# Patient Record
Sex: Male | Born: 1965 | Race: White | Hispanic: No | Marital: Single | Smoking: Current every day smoker
Health system: Southern US, Community
[De-identification: ages and names within clinical notes are randomized; demographics above are authoritative.]

---

## 2007-02-20 ENCOUNTER — Emergency Department: Payer: Self-pay | Admitting: Emergency Medicine

## 2007-06-09 ENCOUNTER — Emergency Department: Payer: Self-pay | Admitting: Emergency Medicine

## 2008-11-01 ENCOUNTER — Emergency Department: Payer: Self-pay | Admitting: Internal Medicine

## 2009-02-01 ENCOUNTER — Inpatient Hospital Stay: Payer: Self-pay | Admitting: Psychiatry

## 2010-04-10 ENCOUNTER — Emergency Department: Payer: Self-pay | Admitting: Emergency Medicine

## 2010-12-19 ENCOUNTER — Emergency Department (HOSPITAL_COMMUNITY)
Admission: EM | Admit: 2010-12-19 | Discharge: 2010-12-23 | Disposition: A | Discharge status: Other Acute Inpatient Hospital | Payer: Self-pay | Attending: Emergency Medicine | Admitting: Emergency Medicine

## 2010-12-19 DIAGNOSIS — N476 Balanoposthitis: Secondary | ICD-10-CM | POA: Insufficient documentation

## 2010-12-19 DIAGNOSIS — R45850 Homicidal ideations: Secondary | ICD-10-CM | POA: Insufficient documentation

## 2010-12-19 DIAGNOSIS — R569 Unspecified convulsions: Secondary | ICD-10-CM | POA: Insufficient documentation

## 2010-12-19 LAB — CBC
MCH: 32.6 pg (ref 26.0–34.0)
MCHC: 35.4 g/dL (ref 30.0–36.0)
MCV: 92.1 fL (ref 78.0–100.0)
Platelets: 282 10*3/uL (ref 150–400)
RDW: 12.3 % (ref 11.5–15.5)

## 2010-12-19 LAB — COMPREHENSIVE METABOLIC PANEL
Albumin: 4.7 g/dL (ref 3.5–5.2)
BUN: 13 mg/dL (ref 6–23)
Creatinine, Ser: 1.22 mg/dL (ref 0.4–1.5)
Total Bilirubin: 0.6 mg/dL (ref 0.3–1.2)
Total Protein: 8.1 g/dL (ref 6.0–8.3)

## 2010-12-19 LAB — RAPID URINE DRUG SCREEN, HOSP PERFORMED
Barbiturates: NOT DETECTED
Benzodiazepines: POSITIVE — AB

## 2010-12-19 LAB — DIFFERENTIAL
Eosinophils Absolute: 0.3 10*3/uL (ref 0.0–0.7)
Eosinophils Relative: 4 % (ref 0–5)
Lymphs Abs: 2.8 10*3/uL (ref 0.7–4.0)
Monocytes Relative: 6 % (ref 3–12)

## 2010-12-19 LAB — PHENYTOIN LEVEL, TOTAL: Phenytoin Lvl: <2.5 ug/mL — ABNORMAL LOW (ref 10.0–20.0)

## 2010-12-20 DIAGNOSIS — F1910 Other psychoactive substance abuse, uncomplicated: Secondary | ICD-10-CM

## 2010-12-21 DIAGNOSIS — F319 Bipolar disorder, unspecified: Secondary | ICD-10-CM
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
## 2010-12-22 DIAGNOSIS — F1910 Other psychoactive substance abuse, uncomplicated: Secondary | ICD-10-CM

## 2010-12-23 ENCOUNTER — Inpatient Hospital Stay (HOSPITAL_COMMUNITY)
Admission: AD | Admit: 2010-12-23 | Discharge: 2010-12-25 | DRG: 885 | Disposition: A | Discharge status: Home / Self Care | Payer: PRIVATE HEALTH INSURANCE | Source: Ambulatory Visit | Attending: Psychiatry | Admitting: Psychiatry

## 2010-12-23 DIAGNOSIS — R45850 Homicidal ideations: Secondary | ICD-10-CM

## 2010-12-23 DIAGNOSIS — F39 Unspecified mood [affective] disorder: Principal | ICD-10-CM

## 2010-12-23 DIAGNOSIS — M549 Dorsalgia, unspecified: Secondary | ICD-10-CM

## 2010-12-23 DIAGNOSIS — F1020 Alcohol dependence, uncomplicated: Secondary | ICD-10-CM

## 2010-12-23 DIAGNOSIS — G40909 Epilepsy, unspecified, not intractable, without status epilepticus: Secondary | ICD-10-CM

## 2010-12-23 DIAGNOSIS — R45851 Suicidal ideations: Secondary | ICD-10-CM

## 2010-12-23 DIAGNOSIS — F1910 Other psychoactive substance abuse, uncomplicated: Secondary | ICD-10-CM

## 2010-12-23 DIAGNOSIS — G8929 Other chronic pain: Secondary | ICD-10-CM

## 2010-12-24 DIAGNOSIS — F1020 Alcohol dependence, uncomplicated: Secondary | ICD-10-CM

## 2010-12-24 DIAGNOSIS — F39 Unspecified mood [affective] disorder: Secondary | ICD-10-CM
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
## 2011-01-18 ENCOUNTER — Emergency Department (HOSPITAL_COMMUNITY)
Admission: EM | Admit: 2011-01-18 | Discharge: 2011-01-19 | Disposition: A | Discharge status: Home / Self Care | Payer: PRIVATE HEALTH INSURANCE | Attending: Emergency Medicine | Admitting: Emergency Medicine

## 2011-01-18 DIAGNOSIS — F1410 Cocaine abuse, uncomplicated: Secondary | ICD-10-CM | POA: Insufficient documentation

## 2011-01-18 DIAGNOSIS — F1010 Alcohol abuse, uncomplicated: Secondary | ICD-10-CM | POA: Insufficient documentation

## 2011-01-18 DIAGNOSIS — R259 Unspecified abnormal involuntary movements: Secondary | ICD-10-CM | POA: Insufficient documentation

## 2011-01-18 LAB — ETHANOL: Alcohol, Ethyl (B): 56 mg/dL — ABNORMAL HIGH (ref 0–10)

## 2011-01-18 LAB — COMPREHENSIVE METABOLIC PANEL
BUN: 10 mg/dL (ref 6–23)
CO2: 28 mEq/L (ref 19–32)
Chloride: 100 mEq/L (ref 96–112)
Creatinine, Ser: 0.95 mg/dL (ref 0.4–1.5)
GFR calc non Af Amer: >60 mL/min (ref >60)
Glucose, Bld: 91 mg/dL (ref 70–99)
Total Bilirubin: 0.4 mg/dL (ref 0.3–1.2)

## 2011-01-18 LAB — DIFFERENTIAL
Basophils Relative: 0 % (ref 0–1)
Monocytes Absolute: 0.4 10*3/uL (ref 0.1–1.0)
Monocytes Relative: 5 % (ref 3–12)
Neutro Abs: 4.6 10*3/uL (ref 1.7–7.7)

## 2011-01-18 LAB — CBC
HCT: 41.9 % (ref 39.0–52.0)
Hemoglobin: 14.4 g/dL (ref 13.0–17.0)
MCH: 32.2 pg (ref 26.0–34.0)
MCHC: 34.4 g/dL (ref 30.0–36.0)
MCV: 93.7 fL (ref 78.0–100.0)

## 2011-01-19 LAB — RAPID URINE DRUG SCREEN, HOSP PERFORMED
Barbiturates: NOT DETECTED
Cocaine: POSITIVE — AB
Opiates: NOT DETECTED

## 2011-12-23 ENCOUNTER — Emergency Department: Payer: Self-pay | Admitting: Emergency Medicine

## 2011-12-23 LAB — URINALYSIS, COMPLETE
Blood: NEGATIVE
Glucose,UR: NEGATIVE mg/dL (ref 0–75)
Ketone: NEGATIVE
Specific Gravity: 1.008 (ref 1.003–1.030)
Squamous Epithelial: NONE SEEN
WBC UR: NONE SEEN /HPF (ref 0–5)

## 2012-06-11 LAB — COMPREHENSIVE METABOLIC PANEL
Anion Gap: 8 (ref 7–16)
BUN: 6 mg/dL — ABNORMAL LOW (ref 7–18)
Bilirubin,Total: 0.4 mg/dL (ref 0.2–1.0)
Chloride: 103 mmol/L (ref 98–107)
Creatinine: 1.05 mg/dL (ref 0.60–1.30)
EGFR (African American): >60
Osmolality: 269 (ref 275–301)
Potassium: 4.1 mmol/L (ref 3.5–5.1)
SGOT(AST): 31 U/L (ref 15–37)
Sodium: 136 mmol/L (ref 136–145)
Total Protein: 8.4 g/dL — ABNORMAL HIGH (ref 6.4–8.2)

## 2012-06-11 LAB — DRUG SCREEN, URINE
Barbiturates, Ur Screen: NEGATIVE (ref ?–200)
Benzodiazepine, Ur Scrn: NEGATIVE (ref ?–200)
Cannabinoid 50 Ng, Ur ~~LOC~~: NEGATIVE (ref ?–50)
Methadone, Ur Screen: NEGATIVE (ref ?–300)
Phencyclidine (PCP) Ur S: NEGATIVE (ref ?–25)

## 2012-06-11 LAB — CBC
HCT: 45.1 % (ref 40.0–52.0)
HGB: 15.3 g/dL (ref 13.0–18.0)
MCHC: 34 g/dL (ref 32.0–36.0)
RDW: 14.4 % (ref 11.5–14.5)
WBC: 10.9 10*3/uL — ABNORMAL HIGH (ref 3.8–10.6)

## 2012-06-11 LAB — ETHANOL: Ethanol: 217 mg/dL

## 2012-06-12 ENCOUNTER — Inpatient Hospital Stay: Payer: Self-pay | Admitting: Psychiatry

## 2012-07-23 ENCOUNTER — Emergency Department: Payer: Self-pay | Admitting: Emergency Medicine

## 2012-10-15 ENCOUNTER — Inpatient Hospital Stay: Payer: Self-pay | Admitting: Psychiatry

## 2012-10-15 LAB — CBC
MCH: 34 pg (ref 26.0–34.0)
MCHC: 35.5 g/dL (ref 32.0–36.0)
MCV: 96 fL (ref 80–100)
Platelet: 266 10*3/uL (ref 150–440)
RDW: 13.5 % (ref 11.5–14.5)

## 2012-10-15 LAB — DRUG SCREEN, URINE
Barbiturates, Ur Screen: NEGATIVE (ref ?–200)
Benzodiazepine, Ur Scrn: NEGATIVE (ref ?–200)
Cannabinoid 50 Ng, Ur ~~LOC~~: NEGATIVE (ref ?–50)
Cocaine Metabolite,Ur ~~LOC~~: POSITIVE (ref ?–300)
Methadone, Ur Screen: NEGATIVE (ref ?–300)

## 2012-10-15 LAB — COMPREHENSIVE METABOLIC PANEL
BUN: 7 mg/dL (ref 7–18)
Chloride: 105 mmol/L (ref 98–107)
Creatinine: 0.92 mg/dL (ref 0.60–1.30)
EGFR (African American): >60
EGFR (Non-African Amer.): >60
Glucose: 102 mg/dL — ABNORMAL HIGH (ref 65–99)
Potassium: 4 mmol/L (ref 3.5–5.1)
SGPT (ALT): 47 U/L (ref 12–78)

## 2012-10-15 LAB — URINALYSIS, COMPLETE
Ketone: NEGATIVE
Leukocyte Esterase: NEGATIVE
Nitrite: NEGATIVE
Ph: 6 (ref 4.5–8.0)
RBC,UR: NONE SEEN /HPF (ref 0–5)
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
## 2015-02-13 NOTE — H&P (Signed)
PATIENT NAME:  Craig Doyle, Craig Doyle MR#:  [id number] DATE OF BIRTH:  May 02, 1966  DATE OF ADMISSION:  10/15/2012  IDENTIFYING INFORMATION: The patient is a 49 year old male with a long history of alcohol dependence, presents to the Emergency Room in disheveled intoxicated state, confused, disorganized, reporting hallucinations.   CHIEF COMPLAINT: "I got off my medicine."   HISTORY OF PRESENT ILLNESS: Information is obtained from the patient and the chart. The patient states that he stopped taking his medicine about two weeks ago, and that is the reason why he started to hear things. He says he has been hearing voices and his mood has been bad. He is not able to give a whole lot of detailed history. He says he probably has not been sleeping well and has been feeling confused. He tries to minimize his drinking. He tells me that he thinks he had a couple of beers yesterday but in general has not been drinking a lot. He denies that he has been using any other recreational drugs. He says he has been living with his family and does not cite any other recent stress. He denies any new physical problems. He denies that he has been vomiting or having trouble keeping food down and denies any recent seizures.   PAST PSYCHIATRIC HISTORY: Multiple hospitalizations under similar circumstances. He tends to present with psychotic symptoms when he is intoxicated which clear up once he is detoxed. He has been treated with serotonin reuptake inhibitors with some benefit to stabilizing his mood but in general when he is sober does not appear to have an ongoing psychotic condition or bipolar condition. He takes Dilantin chronically, although honestly it is probably started for alcohol withdrawal seizures. He does have a history of erratic behavior when he is intoxicated. He also has a past history of suicide attempts. He has also been treated with antipsychotics in the past, although it is not clear that it has made much of a difference  longer term.   PAST MEDICAL HISTORY: The patient has some complaints of chronic pain from arthritis, an unclear seizure disorder which might be simply related to alcohol withdrawal.   SOCIAL HISTORY: He claims that he lives with his family. It is not clear right now what his social situation is. He is not able to work. He has minimal support in the community.   SUBSTANCE ABUSE HISTORY: Long history of alcohol dependence. History of delirium tremens as well as alcoholic hallucinosis in the past, probably alcohol withdrawal seizures as well. History as well of cocaine abuse.   CURRENT MEDICATIONS: According to the patient, it was Paxil 20 mg a day, and Dilantin 300 mg a day, and trazodone 100 mg at night. He says that he has not been taking any of them in a couple of weeks, however.   ALLERGIES: No known drug allergies.   REVIEW OF SYSTEMS: The patient complains of feeling shaky and sick and tired. He says that he has been seeing things today. He denies auditory hallucinations. He denies any acute suicidal ideation.   MENTAL STATUS EXAM: The patient is a very disheveled, poorly groomed man who looks chronically sick. He looks like he has lost weight. He is easily aroused and cooperates with the interview but gets distracted easily and has trouble providing detailed information. Eye contact is decreased. Psychomotor activity is slow and marked by a tremor. His speech is broken, fragmented and somewhat slurred, quiet. Affect is blunted and flat. Mood is stated as being bad. Thoughts are  slow, somewhat disorganized, but he did not make any grossly bizarre statements. He denies any auditory hallucinations but says he has had visual hallucinations today. He denies any current suicidal or homicidal ideation. His judgment and insight are chronically impaired. Intelligence is probably low average. Currently alert and oriented to being in the hospital but not to the specific date.   PHYSICAL  EXAMINATION: GENERAL: The patient is thin and looks like he has probably been losing weight, although I do not have a prior weight immediately to compare it to. No acute skin lesions.  HEENT: Pupils are equal and reactive. Face is symmetric. Eyes move normally. Dry oral mucosa. The patient has full range of motion at all extremities. He was not able to stand up without getting dizzy and having to sit back down, so I could not observe his gait. Strength and reflexes are symmetric and appear normal throughout.  NEUROLOGICAL: Cranial nerves symmetric.  LUNGS: Clear without any wheezes.  HEART: Regular rate and rhythm. Normal heart sounds.  ABDOMEN: Bowel sounds diminished. Abdomen is nontender.  VITAL SIGNS: Temperature 97.8, pulse 79, respirations 18, blood pressure currently 102/65.   LABORATORY RESULTS: Urinalysis is unremarkable. Drug screen positive for cocaine. TSH normal at 0.9. Salicylates slightly elevated at 4.7. CBC normal. Alcohol level at 2:00 this morning was 276.0. Chemistry panel shows elevated AST at 42, elevated total protein 8.3.   ASSESSMENT: A 49 year old man with alcohol dependence, and a history of DTs and alcoholic's hallucinosis. He presents intoxicated, needing alcohol detox to prevent seizures and delirium tremens and control his behavior.   TREATMENT PLAN: Admit to the hospital, put him on seizure precautions. Restart the Dilantin and put him on regular CIWA detox protocol. When he is able to, we will engage him in substance abuse programming and groups and work on appropriate discharge planning. Review current labs. No need to recheck anything at this time.   DIAGNOSIS, PRINCIPAL AND PRIMARY:  AXIS I: Alcohol dependence.   SECONDARY DIAGNOSES: AXIS I:  1. Cocaine abuse versus dependence.  2. 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   AXIS II: Deferred.   AXIS III: Chronic low weight, chronic arthritis pain.   AXIS IV: Moderate stress from  chronic disability and intoxication.   AXIS V: Functioning at time of evaluation 25.  ____________________________ Audery Amel, MD jtc:cb D: 10/15/2012 15:58:34 ET T: 10/15/2012 17:10:08 ET JOB#: [id number]  cc: Audery Amel, MD, <Dictator> Audery Amel MD ELECTRONICALLY SIGNED 10/16/2012 11:38

## 2015-02-13 NOTE — Discharge Summary (Signed)
PATIENT NAME:  Craig Doyle, Craig Doyle MR#:  [id number] DATE OF BIRTH:  11-25-1965  DATE OF ADMISSION:  10/15/2012 DATE OF DISCHARGE:  10/18/2012  HOSPITAL COURSE: See dictated history and physical for details of the admission. This 49 year old male with a history of alcohol dependence and mood disorder presented to the hospital intoxicated, having alcohol withdrawal, mood, irritability, anxiety, needing stabilization. In the hospital, he was treated with the usual alcohol withdrawal protocol. There were no seizures. He was a little bit confused and sedated, but no sign of gross delirium. He did not have any seizures. He was generally cooperative with treatment in the hospital. Required active detox treatment for a couple of days. At this point, he is no longer requiring any Ativan. He is showing normal vital signs, no longer having a tremor. His mental state has returned to baseline. He denies any suicidal ideation. The patient has no interest in returning to any residential treatment. He has been counseled about the life-threatening dangers of continuing to drink and the importance of staying sober and staying compliant with mental health treatment, and understands and agrees to that. He has a place to stay with some friends in the community. He will be discharged today with followup to be arranged through [facility] or similar local mental health followup.   DISCHARGE MEDICATIONS:  Trazodone 100 mg at night, Dilantin 300 mg a day, Paxil 20 mg per day.   LABORATORY RESULTS: Drug screen positive for cocaine. TSH normal at 0.9. Alcohol was 276 at admission. AST was elevated at 42, total protein elevated at 8.3. Glucose slightly elevated at 102. CBC was normal. Urinalysis unremarkable.   MENTAL STATUS EXAM AT DISCHARGE: Disheveled man who looks his stated age, cooperative with the interview. Good eye contact, normal psychomotor activity. Speech normal in rate, tone and volume. Affect euthymic, reactive and appropriate.  Mood stated as being okay. Thoughts are lucid without any loosening of associations or delusions. Denies auditory or visual hallucinations. Denies suicidal or homicidal ideation. Baseline intelligence low average, but judgment and insight at least grossly adequate. Agrees to outpatient treatment. Alert and oriented x4.   DISPOSITION: Discharge back to staying with friends in the community. Follow up with local mental health agency.   DIAGNOSES PRINCIPLE AND PRIMARY:  AXIS I: Alcohol dependence.   SECONDARY DIAGNOSES: AXIS I:  1.  Cocaine dependence.  2.  Mood disorder, not otherwise specified.  AXIS II: Deferred.  AXIS III: History of seizures.  AXIS IV: Moderate chronic stress from poor social support and poor compliance.  AXIS V: Functioning at time of discharge, 50.    ____________________________ Craig AmelJohn T. Clapacs, MD jtc:dm D: 10/18/2012 11:10:12 ET T: 10/18/2012 11:55:35 ET JOB#: [id number]  cc: Craig AmelJohn T. Clapacs, MD, <Dictator> Craig AmelJOHN T CLAPACS MD ELECTRONICALLY SIGNED 10/18/2012 13:09

## 2015-02-13 NOTE — Discharge Summary (Signed)
PATIENT NAME:  Craig Doyle, Craig Doyle MR#:  [id number] DATE OF BIRTH:  07-18-1966  DATE OF ADMISSION:  06/12/2012 DATE OF DISCHARGE:  06/17/2012  HOSPITAL COURSE: See dictated History and Physical for details of admission. This 49 year old man with a history of alcohol dependence came through the Emergency Room requesting detox and to be put back on his medicine. He had been drinking heavily and been off his antidepressants. His mood had been worse with worsening depression and anxiety. In the hospital, the patient was put on the standard detox protocol for alcohol withdrawal. He was somewhat sedated and withdrawn initially but eventually tolerated detox fine without delirium tremens or seizures. He was able to become interactive on the unit and participated in appropriate therapy. The patient was also started back on citalopram 20 mg per day, Dilantin 300 mg per day, and trazodone 100 mg at bedtime which had been previously effective for him. He tolerated medicines well with no side effects. His hygiene improved. Affect improved. The patient was able to articulate his desire to stay on his medicine and quit drinking in order that he could improve relations with his family from whom he has recently been estranged. At no time was he dangerous, aggressive or suicidal. The patient was discharged with plans that he will be staying with friends back in the community, follow up with local Mental Health resources.   DISCHARGE MEDICATIONS:  1. Citalopram 20 mg p.o. daily.  2. Dilantin 300 mg p.o. daily.  3. Trazodone 100 mg p.o. at bedtime.   LABORATORY, DIAGNOSTIC AND RADIOLOGICAL DATA: Admission labs showed a chemistry with a BUN slightly low at 6.0. Total protein slightly high at 8.4. Alcohol level was 217.0. CBC showed a slightly elevated white count at 10.9. Drug screen was positive for cocaine. EKG showed normal sinus rhythm, possible left ventricular hypertrophy. Rib and chest x-ray is normal.   MENTAL STATUS  EXAM AT DISCHARGE: A cleaned up man with more reasonable hygiene, neatly dressed. Eye contact still somewhat decreased. Affect flat-ish. Speech is normal in rate, tone, and volume. Thoughts appear to be somewhat simple but more fluid, no bizarre thinking, no evidence of delusional thinking. He denies hallucinations. He denies suicidal or homicidal ideation. Low-to-average intelligence. Adequate judgment and insight. Short and long-term memory grossly intact.  DISPOSITION: Discharged to the community to live with friends. Continue current medication.  Continue alcohol dependence outpatient treatment. He declined to go to further inpatient rehab.   DIAGNOSIS, PRINCIPAL AND PRIMARY:  AXIS I: Depression, not otherwise specified.   SECONDARY DIAGNOSES:  AXIS I:  1. Alcohol dependence.  2. Cocaine abuse.   AXIS II: Deferred.   AXIS III: Seizure disorder by history.   AXIS IV: Moderate-to-severe stress from having no settled place of his own and limited access to resources.   AXIS V: 50.  ____________________________ Audery AmelJohn T. Clapacs, MD jtc:cbb D: 06/30/2012 23:33:21 ET T: 07/01/2012 10:06:55 ET JOB#: [id number]  cc: Audery AmelJohn T. Clapacs, MD, <Dictator> Audery AmelJOHN T CLAPACS MD ELECTRONICALLY SIGNED 07/01/2012 11:14

## 2015-02-13 NOTE — Consult Note (Signed)
Brief Consult Note: Diagnosis: alcohol dep.   Patient was seen by consultant.   Recommend further assessment or treatment.   Orders entered.   Comments: Psychiatry: Patient seen. Alcohol dependent and with history of depression related to alcohol. Admit for detox. Patient agrees. Orders done.  Electronic Signatures: Audery Amellapacs, John T (MD)  (Signed 17-Aug-13 14:45)  Authored: Brief Consult Note   Last Updated: 17-Aug-13 14:45 by Audery Amellapacs, John T (MD)

## 2015-02-13 NOTE — H&P (Signed)
PATIENT NAME:  Craig Doyle, Salahuddin L MR#:  [id number] DATE OF BIRTH:  [birth year]  DATE OF ADMISSION:  06/12/2012  IDENTIFYING INFORMATION AND CHIEF COMPLAINT:  49 year old man with a history of alcohol dependence presents voluntarily to the Emergency Room asking to be "put back on my medicine".   HISTORY OF PRESENT ILLNESS: Information obtained from the patient and the old chart. The patient says that he has been feeling depressed and he blames this on being off of his "medicine". He feels sad and down. He feels hopeless. He feels like he cannot take care of himself.  He feels like his life is not going anywhere. He is sleeping poorly.  He feels confused in his thinking. He denies having any suicidal thoughts or wishes. He says that occasionally he hears funny things. He denies visual hallucinations. The patient seems to gloss over the fact that he is drinking a 12-pack of beer a day. He has a long history of alcohol dependence and in the past has been diagnosed as having alcohol dependence and depression primarily secondary to alcohol dependence. He tells me that he does not currently have a place to stay and he has been sleeping here and there, sometimes with his mother but often in the woods. His reason for presenting right now is just because he is tired of being the way he is and wants to "get back on my meds". He says this several times but the only medicine that he can specifically remember is Dilantin.   PAST PSYCHIATRIC HISTORY: The patient has a long history of alcohol dependence. He has had several admissions to our hospital in the past for very similar circumstances. The last time he was here was over three years ago. The patient tells me that he has been in prison since then for selling marijuana. He has been treated with antidepressants including Celexa and Zoloft in the past. He says he thinks they have been of some help with his depression and anxiety. Denies suicide attempts. Denies significant  violence. No history of psychosis.   MEDICAL HISTORY: The patient has a history of a seizure disorder. Looking back to old admissions, I see them referred to as withdrawal seizures. It is not clear to me if he has ever actually had seizures without alcohol withdrawal being involved. He tells me that he has, but I am not sure if he is the most reliable historian. In any case, he has been treated with Dilantin in the past. Does not know of any other ongoing medical problems.   SOCIAL HISTORY: The patient has no stable place to stay. His mother has called up here a couple of times and is clearly concerned about him, but he has not been living with her. Not working. Does not appear to be in any intimate relationship or have much in the way of support from other people.   SUBSTANCE ABUSE HISTORY: Long history of alcohol dependence. He is telling me right now that he has not used any other drugs recently and I went over a whole list of them that he denies. His drug screen, however, is positive for cocaine. He does have a history of selling and abusing marijuana in the past. He has a history of blackouts and complicated shaky alcohol withdrawal and withdrawal seizures.   CURRENT MEDICATIONS: None.   ALLERGIES: No known drug allergies.   REVIEW OF SYSTEMS:  He complains of feeling depressed and anxious and not sleeping well. He says that he is currently feeling  shaky and feels sick. He has some auditory hallucinations at times. Denies suicidal ideation.   MENTAL STATUS EXAM:  Disheveled man who looks like he has been living outside. He is tremulous all over. He attempts to be as cooperative as he can with the interview. Eye contact is minimal. Psychomotor activity is minimal but he does have a tremor. His speech is quiet and slurred and frequently difficult to understand. At times he seems to wander off into incoherence, but can usually be redirected back pretty easily. His affect is anxious and blunted. Mood  is stated as being bad. Thoughts are showing poverty of thinking, at times a little bit of confusion, but he does not make any grossly psychotic or bizarre statements and with encouragement and structure he can stay coherent. He endorses that he has had some auditory hallucinations. Denies visual hallucinations. Denies suicidal or homicidal ideation. He actually was oriented to the place and to the season but did not know the month or the year. He was able to repeat three words after a couple of tries and actually was able to remember all of them after about a minute or two.   PHYSICAL EXAMINATION:  GENERAL: 130-pound man which looks underweight for his height. He is disheveled, showing a diffuse tremor all over at rest.   HEENT: Pupils are equal and reactive. Face is symmetric.   NECK: Nontender.   BACK: Nontender.   MUSCULOSKELETAL: Full range of motion at extremities. Has a somewhat unsteady gait, probably from getting dizzy when he stands up. Strength and reflexes normal and symmetric throughout.   NEUROLOGICAL: Cranial nerves symmetric.   LUNGS: Minimal moving of air but no wheezes or extra sounds.   HEART: Regular rate and rhythm with normal heart sounds.   ABDOMEN: Soft, nontender, normal bowel sounds.   CURRENT VITAL SIGNS: Temperature 97.8, pulse 86, respirations 16, blood pressure 100/61. When he first came into the ER his blood pressure was 170/91, pulse 106 but he has gotten some Ativan since then. His skin is very deeply tanned all over. No acute lesions.   RESULTS OF LABORATORY TESTS: Drug screen positive for cocaine. He had rib films because he was complaining of some rib pain, but they are normal. He had a chest x-ray which was normal. TSH normal. Salicylates 3.6, which is slightly elevated. CBC shows a white blood cell count of 10.9, which is slightly elevated. On presentation his alcohol level was 217. Remarkably his liver enzymes are normal. Total protein is slightly  elevated at 8.4, albumin normal. BUN slightly low at 6. No significance to that.   ASSESSMENT:  49 year old man with alcohol dependence and depression and anxiety, probably largely secondary to his alcohol use. History of withdrawal seizures and complicated withdrawal. Very poor social situation. Evidently also using cocaine. Because of his history of alcohol withdrawal seizures and how rough he is looking right now, he needs hospitalization for detox and stabilization.   TREATMENT PLAN: Admit to psychiatry.  Full alcohol withdrawal protocol in place.  I will also restart him on Celexa 20 mg a day and Dilantin 300 mg a day, which he has been on in the past. Once he is detoxed, we will involve him in groups and activities on the unit, especially substance abuse-related. Substance abuse treatment team will work with him. We will see if we can make any arrangements for a better living situation and appropriate outpatient followup.   DIAGNOSIS PRINCIPLE AND PRIMARY:  AXIS I: Alcohol dependence.  SECONDARY DIAGNOSES:  AXIS I:  1. Cocaine abuse.  2. Depression, not otherwise specified.   AXIS II: Deferred.   AXIS III: Alcohol withdrawal seizure disorder.   AXIS IV: Severe from having no stable place to live.   AXIS V: Functioning at time of evaluation: 30.    ____________________________ Audery Amel, MD jtc:bjt D: 06/12/2012 14:55:29 ET T: 06/12/2012 15:29:26 ET JOB#: [id number]  cc: Audery Amel, MD, <Dictator> Audery Amel MD ELECTRONICALLY SIGNED 06/13/2012 16:48

## 2024-06-12 ENCOUNTER — Other Ambulatory Visit: Payer: Self-pay

## 2024-06-12 ENCOUNTER — Emergency Department: Payer: Self-pay

## 2024-06-12 ENCOUNTER — Inpatient Hospital Stay
Admission: EM | Admit: 2024-06-12 | Discharge: 2024-06-22 | DRG: 871 | Disposition: A | Discharge status: Home / Self Care | Payer: MEDICAID | Attending: Obstetrics and Gynecology | Admitting: Obstetrics and Gynecology

## 2024-06-12 DIAGNOSIS — E43 Unspecified severe protein-calorie malnutrition: Secondary | ICD-10-CM | POA: Diagnosis present

## 2024-06-12 DIAGNOSIS — I959 Hypotension, unspecified: Secondary | ICD-10-CM

## 2024-06-12 DIAGNOSIS — D649 Anemia, unspecified: Secondary | ICD-10-CM

## 2024-06-12 DIAGNOSIS — A419 Sepsis, unspecified organism: Secondary | ICD-10-CM | POA: Insufficient documentation

## 2024-06-12 DIAGNOSIS — D75838 Other thrombocytosis: Secondary | ICD-10-CM | POA: Diagnosis present

## 2024-06-12 DIAGNOSIS — Z5982 Transportation insecurity: Secondary | ICD-10-CM

## 2024-06-12 DIAGNOSIS — D75839 Thrombocytosis, unspecified: Secondary | ICD-10-CM

## 2024-06-12 DIAGNOSIS — A4902 Methicillin resistant Staphylococcus aureus infection, unspecified site: Secondary | ICD-10-CM

## 2024-06-12 DIAGNOSIS — K6812 Psoas muscle abscess: Principal | ICD-10-CM | POA: Diagnosis present

## 2024-06-12 DIAGNOSIS — A4901 Methicillin susceptible Staphylococcus aureus infection, unspecified site: Secondary | ICD-10-CM

## 2024-06-12 DIAGNOSIS — Z681 Body mass index (BMI) 19 or less, adult: Secondary | ICD-10-CM

## 2024-06-12 DIAGNOSIS — F17213 Nicotine dependence, cigarettes, with withdrawal: Secondary | ICD-10-CM | POA: Diagnosis present

## 2024-06-12 DIAGNOSIS — A4101 Sepsis due to Methicillin susceptible Staphylococcus aureus: Principal | ICD-10-CM | POA: Diagnosis present

## 2024-06-12 DIAGNOSIS — Z5912 Inadequate housing utilities: Secondary | ICD-10-CM

## 2024-06-12 DIAGNOSIS — Z5320 Procedure and treatment not carried out because of patient's decision for unspecified reasons: Secondary | ICD-10-CM | POA: Diagnosis present

## 2024-06-12 DIAGNOSIS — E871 Hypo-osmolality and hyponatremia: Secondary | ICD-10-CM | POA: Diagnosis present

## 2024-06-12 DIAGNOSIS — R6510 Systemic inflammatory response syndrome (SIRS) of non-infectious origin without acute organ dysfunction: Secondary | ICD-10-CM | POA: Insufficient documentation

## 2024-06-12 DIAGNOSIS — Z5941 Food insecurity: Secondary | ICD-10-CM

## 2024-06-12 DIAGNOSIS — E8809 Other disorders of plasma-protein metabolism, not elsewhere classified: Secondary | ICD-10-CM | POA: Diagnosis present

## 2024-06-12 DIAGNOSIS — D638 Anemia in other chronic diseases classified elsewhere: Secondary | ICD-10-CM | POA: Diagnosis present

## 2024-06-12 LAB — URINALYSIS, ROUTINE W REFLEX MICROSCOPIC
Bacteria, UA: NONE SEEN
Bilirubin Urine: NEGATIVE
Glucose, UA: NEGATIVE mg/dL
Hgb urine dipstick: NEGATIVE
Ketones, ur: 5 mg/dL — AB
Nitrite: NEGATIVE
Protein, ur: 100 mg/dL — AB
Specific Gravity, Urine: 1.023 (ref 1.005–1.030)
pH: 6 (ref 5.0–8.0)

## 2024-06-12 LAB — BASIC METABOLIC PANEL WITH GFR
Anion gap: 11 (ref 5–15)
BUN: 9 mg/dL (ref 6–20)
CO2: 25 mmol/L (ref 22–32)
Calcium: 9.2 mg/dL (ref 8.9–10.3)
Chloride: 98 mmol/L (ref 98–111)
Creatinine, Ser: 0.8 mg/dL (ref 0.61–1.24)
GFR, Estimated: >60 mL/min (ref >60)
Glucose, Bld: 171 mg/dL — ABNORMAL HIGH (ref 70–99)
Potassium: 4.6 mmol/L (ref 3.5–5.1)
Sodium: 134 mmol/L — ABNORMAL LOW (ref 135–145)

## 2024-06-12 LAB — CBC
HCT: 42.3 % (ref 39.0–52.0)
Hemoglobin: 15.2 g/dL (ref 13.0–17.0)
MCH: 34.2 pg — ABNORMAL HIGH (ref 26.0–34.0)
MCHC: 35.9 g/dL (ref 30.0–36.0)
MCV: 95.1 fL (ref 80.0–100.0)
Platelets: 596 K/uL — ABNORMAL HIGH (ref 150–400)
RBC: 4.45 MIL/uL (ref 4.22–5.81)
RDW: 11.9 % (ref 11.5–15.5)
WBC: 18.7 K/uL — ABNORMAL HIGH (ref 4.0–10.5)
nRBC: 0 % (ref 0.0–0.2)

## 2024-06-12 LAB — LACTIC ACID, PLASMA: Lactic Acid, Venous: 0.8 mmol/L (ref 0.5–1.9)

## 2024-06-12 MED ORDER — ACETAMINOPHEN 650 MG RE SUPP: 650.0000 mg | Freq: Four times a day (QID) | RECTAL | Status: DC | PRN

## 2024-06-12 MED ORDER — MORPHINE SULFATE (PF) 2 MG/ML IV SOLN
2.0000 mg | INTRAVENOUS | Status: DC | PRN
  Administered 2024-06-13 – 2024-06-19 (×6): 2 mg via INTRAVENOUS
  Filled 2024-06-12 (×7): qty 1

## 2024-06-12 MED ORDER — VANCOMYCIN HCL 750 MG/150ML IV SOLN
750.0000 mg | Freq: Two times a day (BID) | INTRAVENOUS | Status: DC
  Administered 2024-06-13 – 2024-06-16 (×7): 750 mg via INTRAVENOUS
  Filled 2024-06-12 (×7): qty 150

## 2024-06-12 MED ORDER — IOHEXOL 300 MG/ML  SOLN
100.0000 mL | Freq: Once | INTRAMUSCULAR | Status: AC | PRN
  Administered 2024-06-12: 100 mL via INTRAVENOUS

## 2024-06-12 MED ORDER — PIPERACILLIN-TAZOBACTAM 3.375 G IVPB
3.3750 g | Freq: Three times a day (TID) | INTRAVENOUS | Status: DC
  Administered 2024-06-13 – 2024-06-14 (×5): 3.375 g via INTRAVENOUS
  Filled 2024-06-12 (×5): qty 50

## 2024-06-12 MED ORDER — ONDANSETRON HCL 4 MG/2ML IJ SOLN: 4.0000 mg | Freq: Four times a day (QID) | INTRAMUSCULAR | Status: DC | PRN

## 2024-06-12 MED ORDER — ACETAMINOPHEN 325 MG PO TABS
650.0000 mg | ORAL_TABLET | Freq: Four times a day (QID) | ORAL | Status: DC | PRN
  Filled 2024-06-12: qty 2

## 2024-06-12 MED ORDER — ONDANSETRON HCL 4 MG/2ML IJ SOLN
4.0000 mg | Freq: Once | INTRAMUSCULAR | Status: AC
  Administered 2024-06-12: 4 mg via INTRAVENOUS
  Filled 2024-06-12: qty 2

## 2024-06-12 MED ORDER — SODIUM CHLORIDE 0.9 % IV BOLUS
1000.0000 mL | Freq: Once | INTRAVENOUS | Status: AC
  Administered 2024-06-12: 1000 mL via INTRAVENOUS

## 2024-06-12 MED ORDER — ONDANSETRON HCL 4 MG PO TABS: 4.0000 mg | ORAL_TABLET | Freq: Four times a day (QID) | ORAL | Status: DC | PRN

## 2024-06-12 MED ORDER — HYDROMORPHONE HCL 1 MG/ML IJ SOLN
1.0000 mg | Freq: Once | INTRAMUSCULAR | Status: AC
  Administered 2024-06-12: 1 mg via INTRAVENOUS
  Filled 2024-06-12: qty 1

## 2024-06-12 MED ORDER — KETOROLAC TROMETHAMINE 30 MG/ML IJ SOLN
30.0000 mg | Freq: Four times a day (QID) | INTRAMUSCULAR | Status: AC | PRN
  Administered 2024-06-13 – 2024-06-15 (×8): 30 mg via INTRAVENOUS
  Filled 2024-06-12 (×8): qty 1

## 2024-06-12 MED ORDER — MORPHINE SULFATE (PF) 4 MG/ML IV SOLN
4.0000 mg | Freq: Once | INTRAVENOUS | Status: AC
  Administered 2024-06-12: 4 mg via INTRAVENOUS
  Filled 2024-06-12: qty 1

## 2024-06-12 MED ORDER — PIPERACILLIN-TAZOBACTAM 3.375 G IVPB
3.3750 g | Freq: Once | INTRAVENOUS | Status: AC
  Administered 2024-06-12: 3.375 g via INTRAVENOUS
  Filled 2024-06-12: qty 50

## 2024-06-12 MED ORDER — NICOTINE 14 MG/24HR TD PT24
14.0000 mg | MEDICATED_PATCH | Freq: Every day | TRANSDERMAL | Status: DC
  Administered 2024-06-13 – 2024-06-18 (×7): 14 mg via TRANSDERMAL
  Filled 2024-06-12 (×10): qty 1

## 2024-06-12 MED ORDER — OXYCODONE HCL 5 MG PO TABS
5.0000 mg | ORAL_TABLET | ORAL | Status: DC | PRN
  Administered 2024-06-13 – 2024-06-21 (×21): 5 mg via ORAL
  Filled 2024-06-12 (×22): qty 1

## 2024-06-12 MED ORDER — VANCOMYCIN HCL 1500 MG/300ML IV SOLN
1500.0000 mg | Freq: Once | INTRAVENOUS | Status: AC
  Administered 2024-06-13: 1500 mg via INTRAVENOUS
  Filled 2024-06-12 (×2): qty 300

## 2024-06-12 NOTE — Progress Notes (Signed)
 ED Pharmacy Antibiotic Sign Off An antibiotic consult was received from an ED provider for Zosyn  per pharmacy dosing for psoas abscess. A chart review was completed to assess appropriateness.   The following one time order(s) were placed:  Zosyn  3.375g IV x1  Further antibiotic and/or antibiotic pharmacy consults should be ordered by the admitting provider if indicated.   Thank you for allowing pharmacy to be a part of this patient's care.   Alan Hoe, PharmD 06/12/2024 9:03 PM

## 2024-06-12 NOTE — Progress Notes (Signed)
 Pharmacy Antibiotic Note  Craig Doyle is a 58 y.o. male admitted on 06/12/2024 with cellulitis and intra-abdominal infection.  Pharmacy has been consulted for Vancomycin , Zosyn  dosing.  Plan: Vancomycin  1500mg  IV x1, followed by 750 mg IV q12h (SCr used 0.8, eAUC 454.2, goal AUC 400-550) Zosyn  3.375g IV q8h (4 hour infusion). Monitor renal function, clinical status  Follow up cultures, LOT, and de-escalate as able Consider obtaining Vancomycin  levels if clinically indicated  Height: 6' 1 (185.4 cm) Weight: 61.2 kg (135 lb) IBW/kg (Calculated) : 79.9  Temp (24hrs), Avg:98.4 F (36.9 C), Min:98 F (36.7 C), Max:98.8 F (37.1 C)  Recent Labs  Lab 06/12/24 1824 06/12/24 2102  WBC 18.7*  --   CREATININE 0.80  --   LATICACIDVEN  --  0.8    Estimated Creatinine Clearance: 85 mL/min (by C-G formula based on SCr of 0.8 mg/dL).    No Known Allergies  Antimicrobials this admission: Vancomycin  8/17 >>  Zosyn  8/17 >>   Microbiology results: 8/17 BCx: pending  Thank you for allowing pharmacy to be a part of this patient's care.  Alan Hoe, PharmD 06/12/2024 10:19 PM

## 2024-06-12 NOTE — Assessment & Plan Note (Addendum)
 SIRS SIRS criteria include leukocytosis and tachycardia.  Afebrile here but did report fevers Continue Zosyn , IV fluids Pain control Follow blood cultures IR consulted from the ED N.p.o. from midnight SCD for DVT prophylaxis

## 2024-06-12 NOTE — ED Triage Notes (Signed)
 Pt to ED via [facility] for R groin pain (not testicle) and swelling since 9 days. Also R lower back pain that radiates around to front. Sometimes has dysuria. Has been having urinary frequency.

## 2024-06-12 NOTE — H&P (Signed)
 History and Physical    Patient: Craig Doyle FMW:[id number] DOB: 18-Sep-1966 DOA: 06/12/2024 DOS: the patient was seen and examined on 06/12/2024 PCP: Pcp, No  Patient coming from: Home  Chief Complaint:  Chief Complaint  Patient presents with   Groin Pain   Back Pain   Dysuria    HPI: Craig Doyle is a 58 y.o. male with medical history significant for Tobacco use disorder being admitted with a right psoas abscess.  He presented with a 9-day history of right lower abdominal and low back pain and intermittent fevers.  He denies dysuria, change in bowel habits, nausea or vomiting In the ED he was tachycardic to 116 with otherwise unremarkable vitals.  Labs notable for WBC 18,000, lactic acid pending EKG. showed NSR at 88 CT abdomen and pelvis showing right psoas muscle abscess the ED provider spoke with IR who will provide drainage in the a.m. Patient started on Zosyn  and given a fluid bolus and pain control Admission requested     Review of Systems: As mentioned in the history of present illness. All other systems reviewed and are negative.  History reviewed. No pertinent past medical history. History reviewed. No pertinent surgical history. Social History:  reports that he has been smoking cigarettes. He started smoking about 45 years ago. He has a 22.5 pack-year smoking history. He does not have any smokeless tobacco history on file. He reports that he does not currently use alcohol . He reports that he does not currently use drugs.  No Known Allergies  History reviewed. No pertinent family history.  Prior to Admission medications   Not on File    Physical Exam: Vitals:   06/12/24 1819 06/12/24 1822 06/12/24 2128  BP: (!) 152/107  122/70  Pulse: (!) 116  79  Resp: 20  16  Temp: 98 F (36.7 C)  98.8 F (37.1 C)  TempSrc: Oral  Oral  SpO2: 97%  94%  Weight:  61.2 kg   Height:  6' 1 (1.854 m)    Physical Exam Vitals and nursing note reviewed.  Constitutional:       General: He is not in acute distress. HENT:     Head: Normocephalic and atraumatic.  Cardiovascular:     Rate and Rhythm: Normal rate and regular rhythm.     Heart sounds: Normal heart sounds.  Pulmonary:     Effort: Pulmonary effort is normal.     Breath sounds: Normal breath sounds.  Abdominal:     Palpations: Abdomen is soft.     Tenderness: There is abdominal tenderness in the right lower quadrant.  Neurological:     Mental Status: Mental status is at baseline.     Labs on Admission: I have personally reviewed following labs and imaging studies  CBC: Recent Labs  Lab 06/12/24 1824  WBC 18.7*  HGB 15.2  HCT 42.3  MCV 95.1  PLT 596*   Basic Metabolic Panel: Recent Labs  Lab 06/12/24 1824  NA 134*  K 4.6  CL 98  CO2 25  GLUCOSE 171*  BUN 9  CREATININE 0.80  CALCIUM 9.2   GFR: Estimated Creatinine Clearance: 85 mL/min (by C-G formula based on SCr of 0.8 mg/dL). Liver Function Tests: No results for input(s): AST, ALT, ALKPHOS, BILITOT, PROT, ALBUMIN in the last 168 hours. No results for input(s): LIPASE, AMYLASE in the last 168 hours. No results for input(s): AMMONIA in the last 168 hours. Coagulation Profile: No results for input(s): INR, PROTIME in the last  168 hours. Cardiac Enzymes: No results for input(s): CKTOTAL, CKMB, CKMBINDEX, TROPONINI in the last 168 hours. BNP (last 3 results) No results for input(s): PROBNP in the last 8760 hours. HbA1C: No results for input(s): HGBA1C in the last 72 hours. CBG: No results for input(s): GLUCAP in the last 168 hours. Lipid Profile: No results for input(s): CHOL, HDL, LDLCALC, TRIG, CHOLHDL, LDLDIRECT in the last 72 hours. Thyroid Function Tests: No results for input(s): TSH, T4TOTAL, FREET4, T3FREE, THYROIDAB in the last 72 hours. Anemia Panel: No results for input(s): VITAMINB12, FOLATE, FERRITIN, TIBC, IRON, RETICCTPCT in the last 72  hours. Urine analysis:    Component Value Date/Time   COLORURINE AMBER (A) 06/12/2024 1824   APPEARANCEUR HAZY (A) 06/12/2024 1824   APPEARANCEUR Clear 10/15/2012 0205   LABSPEC 1.023 06/12/2024 1824   LABSPEC 1.001 10/15/2012 0205   PHURINE 6.0 06/12/2024 1824   GLUCOSEU NEGATIVE 06/12/2024 1824   GLUCOSEU Negative 10/15/2012 0205   HGBUR NEGATIVE 06/12/2024 1824   BILIRUBINUR NEGATIVE 06/12/2024 1824   BILIRUBINUR Negative 10/15/2012 0205   KETONESUR 5 (A) 06/12/2024 1824   PROTEINUR 100 (A) 06/12/2024 1824   NITRITE NEGATIVE 06/12/2024 1824   LEUKOCYTESUR TRACE (A) 06/12/2024 1824   LEUKOCYTESUR Negative 10/15/2012 0205    Radiological Exams on Admission: CT ABDOMEN PELVIS W CONTRAST Result Date: 06/12/2024 CLINICAL DATA:  Abdominal pain, flank pain EXAM: CT ABDOMEN AND PELVIS WITH CONTRAST TECHNIQUE: Multidetector CT imaging of the abdomen and pelvis was performed using the standard protocol following bolus administration of intravenous contrast. RADIATION DOSE REDUCTION: This exam was performed according to the departmental dose-optimization program which includes automated exposure control, adjustment of the mA and/or kV according to patient size and/or use of iterative reconstruction technique. CONTRAST:  OMNIPAQUE  IOHEXOL  300 MG/ML  SOLN COMPARISON:  04/10/2010 FINDINGS: Lower chest: No acute findings Hepatobiliary: No focal hepatic abnormality. Gallbladder unremarkable. Pancreas: No focal abnormality or ductal dilatation. Spleen: No focal abnormality.  Normal size. Adrenals/Urinary Tract: No adrenal abnormality. No focal renal abnormality. No stones or hydronephrosis. Urinary bladder is unremarkable. Stomach/Bowel: Stomach, large and small bowel grossly unremarkable. Appendix normal. Vascular/Lymphatic: Aortic atherosclerosis. No evidence of aneurysm or adenopathy. Reproductive: No visible focal abnormality. Other: No free fluid or free air. Musculoskeletal: No acute bony  abnormality. Fluid collection with rim enhancement noted in the right psoas muscle measuring 5 x 2.7 cm compatible with psoas abscess. IMPRESSION: 5 x 2.7 cm fluid collection with rim enhancement in the right psoas muscle compatible with psoas abscess. Aortic atherosclerosis. Electronically Signed   By: Franky Crease M.D.   On: 06/12/2024 20:30   Data Reviewed for HPI: Relevant notes from primary care and specialist visits, past discharge summaries as available in EHR, including Care Everywhere. Prior diagnostic testing as pertinent to current admission diagnoses Updated medications and problem lists for reconciliation ED course, including vitals, labs, imaging, treatment and response to treatment Triage notes, nursing and pharmacy notes and ED provider's notes Notable results as noted above in HPI      Assessment and Plan: * Psoas abscess, right (HCC) SIRS SIRS criteria include leukocytosis and tachycardia.  Afebrile here but did report fevers Continue Zosyn , IV fluids Pain control Follow blood cultures IR consulted from the ED N.p.o. from midnight SCD for DVT prophylaxis        DVT prophylaxis: SCD  Consults: IR  Advance Care Planning: full code  Family Communication: none  Disposition Plan: Back to previous home environment  Severity of Illness: The appropriate patient  status for this patient is OBSERVATION. Observation status is judged to be reasonable and necessary in order to provide the required intensity of service to ensure the patient's safety. The patient's presenting symptoms, physical exam findings, and initial radiographic and laboratory data in the context of their medical condition is felt to place them at decreased risk for further clinical deterioration. Furthermore, it is anticipated that the patient will be medically stable for discharge from the hospital within 2 midnights of admission.   Author: Delayne LULLA Solian, MD 06/12/2024 10:03 PM  For on call  review [email or web address].

## 2024-06-12 NOTE — ED Triage Notes (Signed)
 First nurse note: Patient arrived by [facility] from gas station. Patient walked to gas station. C/o groing pain and right flank pain. Reports trouble urinating for days. Describes pain as sharp  A&O x4 per EMS  EMS vitals: 96% RA 110HR 126/86 b/p 97.4oral 135CBG

## 2024-06-12 NOTE — ED Provider Notes (Signed)
 [facility] Emergency Department Provider Note     Event Date/Time   First MD Initiated Contact with Patient 06/12/24 1924     (approximate)   History   Groin Pain, Back Pain, and Dysuria   HPI  Craig Doyle is a 58 y.o. male with no significant past medical history presents to the ED for evaluation of right sharp lower abdomen pain and right flank pain x 9 days.  Associated symptoms include intermittent subjective fevers and burning with urination.  Also notes urinary frequency.  No hematuria.  Also states right sided groin area appears to be swollen.  Has tried nothing for pain. No pain to testicles. Denies injury or trauma.     Physical Exam   Triage Vital Signs: ED Triage Vitals  Encounter Vitals Group     BP 06/12/24 1819 (!) 152/107     Girls Systolic BP Percentile --      Girls Diastolic BP Percentile --      Boys Systolic BP Percentile --      Boys Diastolic BP Percentile --      Pulse Rate 06/12/24 1819 (!) 116     Resp 06/12/24 1819 20     Temp 06/12/24 1819 98 F (36.7 C)     Temp Source 06/12/24 1819 Oral     SpO2 06/12/24 1819 97 %     Weight 06/12/24 1822 135 lb (61.2 kg)     Height 06/12/24 1822 6' 1 (1.854 m)     Head Circumference --      Peak Flow --      Pain Score 06/12/24 1820 10     Pain Loc --      Pain Education --      Exclude from Growth Chart --     Most recent vital signs: Vitals:   06/12/24 1819 06/12/24 2128  BP: (!) 152/107 122/70  Pulse: (!) 116 79  Resp: 20 16  Temp: 98 F (36.7 C) 98.8 F (37.1 C)  SpO2: 97% 94%   General: Obvious discomfort.  Alert and oriented. INAD.  Skin:  Warm, dry and intact. No rashes or lesions noted.     Head:  NCAT.  Eyes:  PERRLA. EOMI.  CV:  Good peripheral perfusion. RRR. No peripheral edema.  RESP:  Normal effort. LCTAB. No retractions.  ABD:  No distention. Soft, tenderness to RLQ. Tenderness to right flank region. Skin appears normal. No guarding or rebound  tenderness.  MSK:   Full ROM in all joints. No swelling, deformity or tenderness.  OTHER:  No noted swelling to groin region over right inguinal lymph node      ED Results / Procedures / Treatments   Labs (all labs ordered are listed, but only abnormal results are displayed) Labs Reviewed  URINALYSIS, ROUTINE W REFLEX MICROSCOPIC - Abnormal; Notable for the following components:      Result Value   Color, Urine AMBER (*)    APPearance HAZY (*)    Ketones, ur 5 (*)    Protein, ur 100 (*)    Leukocytes,Ua TRACE (*)    All other components within normal limits  BASIC METABOLIC PANEL WITH GFR - Abnormal; Notable for the following components:   Sodium 134 (*)    Glucose, Bld 171 (*)    All other components within normal limits  CBC - Abnormal; Notable for the following components:   WBC 18.7 (*)    MCH 34.2 (*)  Platelets 596 (*)    All other components within normal limits  URINE CULTURE  LACTIC ACID, PLASMA  LACTIC ACID, PLASMA    RADIOLOGY  I personally viewed and evaluated these images as part of my medical decision making, as well as reviewing the written report by the radiologist.  ED Provider Interpretation: Right psoas muscle abscess  CT ABDOMEN PELVIS W CONTRAST Result Date: 06/12/2024 CLINICAL DATA:  Abdominal pain, flank pain EXAM: CT ABDOMEN AND PELVIS WITH CONTRAST TECHNIQUE: Multidetector CT imaging of the abdomen and pelvis was performed using the standard protocol following bolus administration of intravenous contrast. RADIATION DOSE REDUCTION: This exam was performed according to the departmental dose-optimization program which includes automated exposure control, adjustment of the mA and/or kV according to patient size and/or use of iterative reconstruction technique. CONTRAST:  OMNIPAQUE  IOHEXOL  300 MG/ML  SOLN COMPARISON:  04/10/2010 FINDINGS: Lower chest: No acute findings Hepatobiliary: No focal hepatic abnormality. Gallbladder unremarkable. Pancreas: No  focal abnormality or ductal dilatation. Spleen: No focal abnormality.  Normal size. Adrenals/Urinary Tract: No adrenal abnormality. No focal renal abnormality. No stones or hydronephrosis. Urinary bladder is unremarkable. Stomach/Bowel: Stomach, large and small bowel grossly unremarkable. Appendix normal. Vascular/Lymphatic: Aortic atherosclerosis. No evidence of aneurysm or adenopathy. Reproductive: No visible focal abnormality. Other: No free fluid or free air. Musculoskeletal: No acute bony abnormality. Fluid collection with rim enhancement noted in the right psoas muscle measuring 5 x 2.7 cm compatible with psoas abscess. IMPRESSION: 5 x 2.7 cm fluid collection with rim enhancement in the right psoas muscle compatible with psoas abscess. Aortic atherosclerosis. Electronically Signed   By: Franky Crease M.D.   On: 06/12/2024 20:30    PROCEDURES:  Critical Care performed: No  Procedures   MEDICATIONS ORDERED IN ED: Medications  piperacillin -tazobactam (ZOSYN ) IVPB 3.375 g (3.375 g Intravenous New Bag/Given 06/12/24 2125)  ondansetron  (ZOFRAN ) injection 4 mg (4 mg Intravenous Given 06/12/24 1951)  morphine  (PF) 4 MG/ML injection 4 mg (4 mg Intravenous Given 06/12/24 1953)  sodium chloride  0.9 % bolus 1,000 mL (0 mLs Intravenous Stopped 06/12/24 2125)  iohexol  (OMNIPAQUE ) 300 MG/ML solution 100 mL (100 mLs Intravenous Contrast Given 06/12/24 2005)  HYDROmorphone  (DILAUDID ) injection 1 mg (1 mg Intravenous Given 06/12/24 2042)     IMPRESSION / MDM / ASSESSMENT AND PLAN / ED COURSE  I reviewed the triage vital signs and the nursing notes.                              Clinical Course as of 06/12/24 2200  Sun Jun 12, 2024  2058 NPO after midnight; SCD; No heparin  [MH]    Clinical Course User Index [MH] Margrette Monte A, PA-C    58 y.o. male presents to the emergency department for evaluation and treatment of right abdominal/flank pain. See HPI for further details.   Differential diagnosis  includes, but is not limited to, acute appendicitis, renal colic, urinary tract infection/pyelonephritis, nephrolithiasis, diverticulitis, small bowel obstruction or ileus, colitis, gastroenteritis,   Patient's presentation is most consistent with acute complicated illness / injury requiring diagnostic workup.  CBC reveals leukocytosis of 18.7, BMP within normal limits.  Urinalysis suspicious for possible infectious etiology will send urine culture.  CT abdomen and pelvis obtained revealing right psoas muscle abscess measuring 5 x 2.7 cm.  Discussed with IR who will be able to drain abscess tomorrow.  Will admit patient for pain control, IV fluids and antibiotics.  Hospitalist  called and accepted patient for admission. Transferring care.   At this time patient is in stable condition.  He is in agreement with this care plan.  FINAL CLINICAL IMPRESSION(S) / ED DIAGNOSES   Final diagnoses:  Psoas abscess, right (HCC)   Rx / DC Orders   ED Discharge Orders     None      Note:  This document was prepared using Dragon voice recognition software and may include unintentional dictation errors.    Margrette, Angle Dirusso A, PA-C 06/12/24 2201    Waymond Lorelle Cummins, MD 06/12/24 [phone]

## 2024-06-13 ENCOUNTER — Observation Stay: Payer: Self-pay

## 2024-06-13 DIAGNOSIS — D638 Anemia in other chronic diseases classified elsewhere: Secondary | ICD-10-CM | POA: Diagnosis present

## 2024-06-13 DIAGNOSIS — F17213 Nicotine dependence, cigarettes, with withdrawal: Secondary | ICD-10-CM | POA: Diagnosis present

## 2024-06-13 DIAGNOSIS — E871 Hypo-osmolality and hyponatremia: Secondary | ICD-10-CM | POA: Diagnosis present

## 2024-06-13 DIAGNOSIS — K6812 Psoas muscle abscess: Secondary | ICD-10-CM | POA: Diagnosis not present

## 2024-06-13 DIAGNOSIS — D75839 Thrombocytosis, unspecified: Secondary | ICD-10-CM | POA: Diagnosis not present

## 2024-06-13 DIAGNOSIS — E43 Unspecified severe protein-calorie malnutrition: Secondary | ICD-10-CM | POA: Diagnosis present

## 2024-06-13 DIAGNOSIS — D72829 Elevated white blood cell count, unspecified: Secondary | ICD-10-CM | POA: Diagnosis not present

## 2024-06-13 DIAGNOSIS — Z5982 Transportation insecurity: Secondary | ICD-10-CM | POA: Diagnosis not present

## 2024-06-13 DIAGNOSIS — I959 Hypotension, unspecified: Secondary | ICD-10-CM

## 2024-06-13 DIAGNOSIS — E8809 Other disorders of plasma-protein metabolism, not elsewhere classified: Secondary | ICD-10-CM | POA: Diagnosis present

## 2024-06-13 DIAGNOSIS — Z5912 Inadequate housing utilities: Secondary | ICD-10-CM | POA: Diagnosis not present

## 2024-06-13 DIAGNOSIS — Z681 Body mass index (BMI) 19 or less, adult: Secondary | ICD-10-CM | POA: Diagnosis not present

## 2024-06-13 DIAGNOSIS — D75838 Other thrombocytosis: Secondary | ICD-10-CM | POA: Diagnosis present

## 2024-06-13 DIAGNOSIS — Z5941 Food insecurity: Secondary | ICD-10-CM | POA: Diagnosis not present

## 2024-06-13 DIAGNOSIS — A4101 Sepsis due to Methicillin susceptible Staphylococcus aureus: Secondary | ICD-10-CM | POA: Diagnosis present

## 2024-06-13 DIAGNOSIS — Z5320 Procedure and treatment not carried out because of patient's decision for unspecified reasons: Secondary | ICD-10-CM | POA: Diagnosis present

## 2024-06-13 DIAGNOSIS — A419 Sepsis, unspecified organism: Secondary | ICD-10-CM | POA: Insufficient documentation

## 2024-06-13 DIAGNOSIS — I9589 Other hypotension: Secondary | ICD-10-CM

## 2024-06-13 DIAGNOSIS — D649 Anemia, unspecified: Secondary | ICD-10-CM

## 2024-06-13 DIAGNOSIS — B9561 Methicillin susceptible Staphylococcus aureus infection as the cause of diseases classified elsewhere: Secondary | ICD-10-CM | POA: Diagnosis not present

## 2024-06-13 LAB — BASIC METABOLIC PANEL WITH GFR
Anion gap: 11 (ref 5–15)
BUN: 11 mg/dL (ref 6–20)
CO2: 25 mmol/L (ref 22–32)
Calcium: 8.6 mg/dL — ABNORMAL LOW (ref 8.9–10.3)
Chloride: 102 mmol/L (ref 98–111)
Creatinine, Ser: 0.96 mg/dL (ref 0.61–1.24)
GFR, Estimated: >60 mL/min (ref >60)
Glucose, Bld: 99 mg/dL (ref 70–99)
Potassium: 3.8 mmol/L (ref 3.5–5.1)
Sodium: 138 mmol/L (ref 135–145)

## 2024-06-13 LAB — CBC
HCT: 35.1 % — ABNORMAL LOW (ref 39.0–52.0)
Hemoglobin: 12.4 g/dL — ABNORMAL LOW (ref 13.0–17.0)
MCH: 34.1 pg — ABNORMAL HIGH (ref 26.0–34.0)
MCHC: 35.3 g/dL (ref 30.0–36.0)
MCV: 96.4 fL (ref 80.0–100.0)
Platelets: 488 K/uL — ABNORMAL HIGH (ref 150–400)
RBC: 3.64 MIL/uL — ABNORMAL LOW (ref 4.22–5.81)
RDW: 11.9 % (ref 11.5–15.5)
WBC: 16.7 K/uL — ABNORMAL HIGH (ref 4.0–10.5)
nRBC: 0 % (ref 0.0–0.2)

## 2024-06-13 LAB — HIV ANTIBODY (ROUTINE TESTING W REFLEX): HIV Screen 4th Generation wRfx: NONREACTIVE

## 2024-06-13 LAB — LACTIC ACID, PLASMA: Lactic Acid, Venous: 0.8 mmol/L (ref 0.5–1.9)

## 2024-06-13 MED ORDER — FENTANYL CITRATE (PF) 100 MCG/2ML IJ SOLN
INTRAMUSCULAR | Status: AC | PRN
  Administered 2024-06-13 (×2): 50 ug via INTRAVENOUS

## 2024-06-13 MED ORDER — MIDAZOLAM HCL 2 MG/2ML IJ SOLN
INTRAMUSCULAR | Status: AC
  Filled 2024-06-13: qty 2

## 2024-06-13 MED ORDER — FENTANYL CITRATE (PF) 100 MCG/2ML IJ SOLN
INTRAMUSCULAR | Status: AC
Start: 2024-06-13 — End: 2024-06-13
  Filled 2024-06-13: qty 2

## 2024-06-13 MED ORDER — SODIUM CHLORIDE 0.9 % IV BOLUS
500.0000 mL | Freq: Once | INTRAVENOUS | Status: AC
  Administered 2024-06-13: 500 mL via INTRAVENOUS

## 2024-06-13 MED ORDER — SODIUM CHLORIDE 0.9% FLUSH
10.0000 mL | Freq: Three times a day (TID) | INTRAVENOUS | Status: DC
  Administered 2024-06-13 – 2024-06-22 (×26): 10 mL

## 2024-06-13 MED ORDER — SODIUM CHLORIDE 0.9 % IV SOLN: INTRAVENOUS | Status: AC

## 2024-06-13 MED ORDER — MIDAZOLAM HCL 2 MG/2ML IJ SOLN
INTRAMUSCULAR | Status: AC | PRN
  Administered 2024-06-13 (×2): 1 mg via INTRAVENOUS

## 2024-06-13 NOTE — Assessment & Plan Note (Signed)
 Secondary to sepsis.

## 2024-06-13 NOTE — Consult Note (Signed)
 Chief Complaint: Patient was seen in consultation today for right psoas abscess, with consideration for drain placement.  Referring Provider(s): Dr. Delayne Solian, MD.   Supervising Physician: Karalee Beat  Patient Status: [facility] - In-pt  Patient is Full Code  History of Present Illness: Craig Doyle is a 58 y.o. male  with PMHx notable for tobacco use disorder.  Per Dr. Elfredia H&P note yesterday: [Patient is] admitted with a right psoas abscess.  He presented with a 9-day history of right lower abdominal and low back pain and intermittent fevers.  He denies dysuria, change in bowel habits, nausea or vomiting In the ED he was tachycardic to 116 with otherwise unremarkable vitals.  Labs notable for WBC 18,000, lactic acid pending EKG. showed NSR at 88 CT abdomen and pelvis showing right psoas muscle abscess the ED provider spoke with IR who will provide drainage in the a.m. Patient started on Zosyn  and given a fluid bolus and pain control.   Interventional Radiology was requested for right psoas fluid collection drainage. Request was reviewed and approved by Dr. Karalee. Patient is tentatively scheduled for same in IR today.   Patient is alert and laying in bed, calm.  Patient is currently complaining of significant pain from right lower lateral flank and right inguinal region. Patient denies any fevers, headache, chest pain, SOB, cough, abdominal pain, nausea, vomiting or bleeding.    History reviewed. No pertinent past medical history.  History reviewed. No pertinent surgical history.  Allergies: Patient has no known allergies.  Medications: Prior to Admission medications   Medication Sig Start Date End Date Taking? Authorizing Provider  ibuprofen (ADVIL) 200 MG tablet Take 600 mg by mouth every 6 (six) hours as needed for moderate pain (pain score 4-6) or mild pain (pain score 1-3).   Yes [provider]  Pseudoeph-Doxylamine-DM-APAP (NYQUIL PO)  Take 15 mLs by mouth every 6 (six) hours.   Yes [provider]     History reviewed. No pertinent family history.  Social History   Socioeconomic History   Marital status: Single    Spouse name: Not on file   Number of children: Not on file   Years of education: Not on file   Highest education level: Not on file  Occupational History   Not on file  Tobacco Use   Smoking status: Every Day    Current packs/day: 1.00    Average packs/day: 0.6 packs/day for 45.0 years (25.8 ttl pk-yrs)    Types: Cigarettes    Start date: 06/13/1979    Passive exposure: Current   Smokeless tobacco: Never  Vaping Use   Vaping status: Never Used  Substance and Sexual Activity   Alcohol  use: Not Currently    Comment: 2 40's a day, none since 9 days   Drug use: Not Currently   Sexual activity: Not Currently  Other Topics Concern   Not on file  Social History Narrative   Not on file   Social Drivers of Health   Financial Resource Strain: Not on file  Food Insecurity: Food Insecurity Present (06/12/2024)   Hunger Vital Sign    Worried About Running Out of Food in the Last Year: Often true    Ran Out of Food in the Last Year: Often true  Transportation Needs: Unmet Transportation Needs (06/12/2024)   PRAPARE - Administrator, Civil Service (Medical): Yes    Lack of Transportation (Non-Medical): Yes  Physical Activity: Not on file  Stress:  Not on file  Social Connections: Not on file     Review of Systems: A 12 point ROS discussed and pertinent positives are indicated in the HPI above.  All other systems are negative.  Vital Signs: BP (!) 93/58 (BP Location: Right Arm)   Pulse (!) 54   Temp 97.8 F (36.6 C) (Oral)   Resp 16   Ht 6' 1 (1.854 m)   Wt 131 lb 9.8 oz (59.7 kg)   SpO2 97%   BMI 17.36 kg/m   Advance Care Plan: The advanced care place/surrogate decision maker was discussed at the time of visit and the patient did not wish to discuss or was not able to  name a surrogate decision maker or provide an advance care plan.  Physical Exam Vitals reviewed.  Constitutional:      General: He is not in acute distress.    Appearance: Normal appearance.  HENT:     Mouth/Throat:     Mouth: Mucous membranes are dry.  Cardiovascular:     Rate and Rhythm: Normal rate and regular rhythm.  Pulmonary:     Effort: Pulmonary effort is normal.  Abdominal:     General: Abdomen is flat.     Palpations: Abdomen is soft.     Tenderness: There is abdominal tenderness. There is guarding.      Comments: Right flank and right inguinal moderate tenderness to palpation.   Musculoskeletal:        General: Normal range of motion.     Cervical back: Normal range of motion.  Skin:    General: Skin is warm and dry.  Neurological:     Mental Status: He is alert and oriented to person, place, and time.  Psychiatric:        Mood and Affect: Mood normal.        Behavior: Behavior normal.        Thought Content: Thought content normal.        Judgment: Judgment normal.     Imaging: CT ABDOMEN PELVIS W CONTRAST Result Date: 06/12/2024 CLINICAL DATA:  Abdominal pain, flank pain EXAM: CT ABDOMEN AND PELVIS WITH CONTRAST TECHNIQUE: Multidetector CT imaging of the abdomen and pelvis was performed using the standard protocol following bolus administration of intravenous contrast. RADIATION DOSE REDUCTION: This exam was performed according to the departmental dose-optimization program which includes automated exposure control, adjustment of the mA and/or kV according to patient size and/or use of iterative reconstruction technique. CONTRAST:  OMNIPAQUE  IOHEXOL  300 MG/ML  SOLN COMPARISON:  04/10/2010 FINDINGS: Lower chest: No acute findings Hepatobiliary: No focal hepatic abnormality. Gallbladder unremarkable. Pancreas: No focal abnormality or ductal dilatation. Spleen: No focal abnormality.  Normal size. Adrenals/Urinary Tract: No adrenal abnormality. No focal renal  abnormality. No stones or hydronephrosis. Urinary bladder is unremarkable. Stomach/Bowel: Stomach, large and small bowel grossly unremarkable. Appendix normal. Vascular/Lymphatic: Aortic atherosclerosis. No evidence of aneurysm or adenopathy. Reproductive: No visible focal abnormality. Other: No free fluid or free air. Musculoskeletal: No acute bony abnormality. Fluid collection with rim enhancement noted in the right psoas muscle measuring 5 x 2.7 cm compatible with psoas abscess. IMPRESSION: 5 x 2.7 cm fluid collection with rim enhancement in the right psoas muscle compatible with psoas abscess. Aortic atherosclerosis. Electronically Signed   By: Franky Crease M.D.   On: 06/12/2024 20:30    Labs:  CBC: Recent Labs    06/12/24 1824 06/13/24 0604  WBC 18.7* 16.7*  HGB 15.2 12.4*  HCT 42.3 35.1*  PLT 596* 488*    COAGS: No results for input(s): INR, APTT in the last 8760 hours.  BMP: Recent Labs    06/12/24 1824 06/13/24 0604  NA 134* 138  K 4.6 3.8  CL 98 102  CO2 25 25  GLUCOSE 171* 99  BUN 9 11  CALCIUM 9.2 8.6*  CREATININE 0.80 0.96  GFRNONAA >60 >60    LIVER FUNCTION TESTS: No results for input(s): BILITOT, AST, ALT, ALKPHOS, PROT, ALBUMIN in the last 8760 hours.  TUMOR MARKERS: No results for input(s): AFPTM, CEA, CA199, CHROMGRNA in the last 8760 hours.  Assessment and Plan: Per Dr. Elfredia H&P note yesterday: [Patient is] admitted with a right psoas abscess.  He presented with a 9-day history of right lower abdominal and low back pain and intermittent fevers.  [...] CT abdomen and pelvis showing right psoas muscle abscess the ED provider spoke with IR who will provide drainage in the a.m. Patient started on Zosyn  and given a fluid bolus and pain control.  Patient will present for tentatively scheduled right psoas fluid collection drainage in IR today.  Patient has been NPO since midnight.  All labs and medications are within  acceptable parameters.  No pertinent allergies.   Risks and benefits discussed with the patient including bleeding, infection, damage to adjacent structures, bowel perforation/fistula connection, and sepsis.  All of the patient's questions were answered, patient is agreeable to proceed. Consent signed and in chart.    Thank you for allowing our service to participate in AMARIEN CARNE 's care.  Electronically Signed: Carlin DELENA Griffon, PA-C   06/13/2024, 11:55 AM      I spent a total of 40 Minutes in face to face in clinical consultation, greater than 50% of which was counseling/coordinating care for right psoas abscess, with consideration for drain placement.

## 2024-06-13 NOTE — Assessment & Plan Note (Signed)
 Present on admission with leukocytosis tachycardia and psoas muscle abscess.  Case discussed with interventional radiology and they will bring down for drainage procedure today.

## 2024-06-13 NOTE — Hospital Course (Signed)
 58 y.o. male with medical history significant for Tobacco use disorder being admitted with a right psoas abscess.  He presented with a 9-day history of right lower abdominal and low back pain and intermittent fevers.  He denies dysuria, change in bowel habits, nausea or vomiting In the ED he was tachycardic to 116 with otherwise unremarkable vitals.  Labs notable for WBC 18,000, lactic acid pending EKG. showed NSR at 88 CT abdomen and pelvis showing right psoas muscle abscess the ED provider spoke with IR who will provide drainage in the a.m. Patient started on Zosyn  and given a fluid bolus and pain control Admission requested   8/18.  Case discussed with interventional radiology and they will bring down for drainage of abscess.  Continue IV antibiotics. 8/19.  Staphylococcus aureus growing out of culture.  Sensitivities pending.  Continue IV vancomycin  and get rid of Zosyn .

## 2024-06-13 NOTE — Assessment & Plan Note (Signed)
 Improved with fluid bolus

## 2024-06-13 NOTE — Procedures (Signed)
 Interventional Radiology Procedure Note  Procedure: CT guided placement of 73F drain into right retroperitoneal abscess  Complications: None  Estimated Blood Loss: None  Recommendations: - Evacuated 35 mL thick bloody purulent fluid - Drain to JP - Flush aggressively TID   Signed,  Wilkie LOIS Lent, MD

## 2024-06-13 NOTE — Progress Notes (Signed)
 Patient arrieved from ED at 2255. Patient alert and oriented x 4 and able to make his needs known. Patient requested to take a shower when IV ABT completed and a snack before midnight since he will be NPO/ ABT completed, IV wrapped and snack was provided after shower was completed. Requested PRN pain medication, Morphine  given as ordered. Resting in bed, call light and personal items within reach.

## 2024-06-13 NOTE — Progress Notes (Signed)
 Patient clinically stable post CT abscess 12 Fr drain placement per DR Karalee, tolerated well. Received Versed  2 mg along with Fentanyl  100 mcg IV for procedure. 35 cc purulent thick bloody fluid removed with placement. Report given to [facility] Rn/1a/at bedside post procedure/recovery.

## 2024-06-13 NOTE — Assessment & Plan Note (Signed)
Last sodium 134

## 2024-06-13 NOTE — Progress Notes (Signed)
 Progress Note   Patient: Craig Doyle FMW:[id number] DOB: September 23, 1966 DOA: 06/12/2024     0 DOS: the patient was seen and examined on 06/13/2024   Brief hospital course: 58 y.o. male with medical history significant for Tobacco use disorder being admitted with a right psoas abscess.  He presented with a 9-day history of right lower abdominal and low back pain and intermittent fevers.  He denies dysuria, change in bowel habits, nausea or vomiting In the ED he was tachycardic to 116 with otherwise unremarkable vitals.  Labs notable for WBC 18,000, lactic acid pending EKG. showed NSR at 88 CT abdomen and pelvis showing right psoas muscle abscess the ED provider spoke with IR who will provide drainage in the a.m. Patient started on Zosyn  and given a fluid bolus and pain control Admission requested   8/18.  Case discussed with interventional radiology and they will bring down for drainage of abscess.  Continue IV antibiotics.  Assessment and Plan: * Sepsis due to undetermined organism without resultant organ failure [facility]) Present on admission with leukocytosis tachycardia and psoas muscle abscess.  Case discussed with interventional radiology and they will bring down for drainage procedure today.  Psoas abscess, right (HCC) Continue IV Zosyn  and vancomycin   Hypotension IV fluid bolus  Thrombocytosis Secondary to sepsis  Hyponatremia Improved  Anemia, unspecified Will send off iron studies.        Subjective: Patient states he was lying on his couch for about a week not eating or drinking very much.  Admitted with sepsis and right psoas muscle abscess.  No recent history of drug use.  Physical Exam: Vitals:   06/12/24 2128 06/12/24 2304 06/13/24 0436 06/13/24 0742  BP: 122/70  99/61 (!) 93/58  Pulse: 79  (!) 56 (!) 54  Resp: 16  16 16   Temp: 98.8 F (37.1 C)  97.8 F (36.6 C) 97.8 F (36.6 C)  TempSrc: Oral   Oral  SpO2: 94%  99% 97%  Weight:  59.7 kg    Height:  6' 1  (1.854 m)     Physical Exam HENT:     Head: Normocephalic.     Mouth/Throat:     Pharynx: No oropharyngeal exudate.  Eyes:     General: Lids are normal.     Conjunctiva/sclera: Conjunctivae normal.  Cardiovascular:     Rate and Rhythm: Normal rate and regular rhythm.     Heart sounds: Normal heart sounds, S1 normal and S2 normal.  Pulmonary:     Breath sounds: No decreased breath sounds, wheezing, rhonchi or rales.  Abdominal:     Palpations: Abdomen is soft.     Tenderness: There is abdominal tenderness in the right lower quadrant.     Comments: Right flank pain  Musculoskeletal:     Right lower leg: No swelling.     Left lower leg: No swelling.  Skin:    General: Skin is warm.     Findings: No rash.  Neurological:     Mental Status: He is alert and oriented to person, place, and time.     Comments: Able to straight leg raise     Data Reviewed: White blood count 16.7, hemoglobin 12.4, platelet count 488, creatinine 0.96  Disposition: Status is: Inpatient Remains inpatient appropriate because: Interventional radiology to take for drainage procedure today.  Continue IV antibiotics and follow-up cultures.  Planned Discharge Destination: Home    Time spent: 28 minutes  Author: Charlie Patterson, MD 06/13/2024 2:14 PM  For on  call review [email or web address].

## 2024-06-13 NOTE — Plan of Care (Signed)

## 2024-06-13 NOTE — Plan of Care (Signed)
  Problem: Education: Goal: Knowledge of General Education information will improve Description: Including pain rating scale, medication(s)/side effects and non-pharmacologic comfort measures Outcome: Progressing   Problem: Clinical Measurements: Goal: Respiratory complications will improve Outcome: Progressing Goal: Cardiovascular complication will be avoided Outcome: Progressing   Problem: Activity: Goal: Risk for activity intolerance will decrease Outcome: Progressing   Problem: Nutrition: Goal: Adequate nutrition will be maintained Outcome: Progressing   Problem: Elimination: Goal: Will not experience complications related to bowel motility Outcome: Progressing Goal: Will not experience complications related to urinary retention Outcome: Progressing

## 2024-06-13 NOTE — Assessment & Plan Note (Signed)
 Will send off iron studies.

## 2024-06-14 ENCOUNTER — Inpatient Hospital Stay: Payer: Self-pay

## 2024-06-14 DIAGNOSIS — B9561 Methicillin susceptible Staphylococcus aureus infection as the cause of diseases classified elsewhere: Secondary | ICD-10-CM

## 2024-06-14 DIAGNOSIS — D72829 Elevated white blood cell count, unspecified: Secondary | ICD-10-CM

## 2024-06-14 DIAGNOSIS — D638 Anemia in other chronic diseases classified elsewhere: Secondary | ICD-10-CM | POA: Insufficient documentation

## 2024-06-14 DIAGNOSIS — A4101 Sepsis due to Methicillin susceptible Staphylococcus aureus: Secondary | ICD-10-CM

## 2024-06-14 LAB — HEPATIC FUNCTION PANEL
ALT: 24 U/L (ref 0–44)
AST: 29 U/L (ref 15–41)
Albumin: 2.2 g/dL — ABNORMAL LOW (ref 3.5–5.0)
Alkaline Phosphatase: 66 U/L (ref 38–126)
Bilirubin, Direct: <0.1 mg/dL (ref 0.0–0.2)
Total Bilirubin: 0.6 mg/dL (ref 0.0–1.2)
Total Protein: 5.4 g/dL — ABNORMAL LOW (ref 6.5–8.1)

## 2024-06-14 LAB — URINE CULTURE: Culture: <10000 — AB

## 2024-06-14 LAB — CBC WITH DIFFERENTIAL/PLATELET
Abs Immature Granulocytes: 0.14 K/uL — ABNORMAL HIGH (ref 0.00–0.07)
Basophils Absolute: 0.1 K/uL (ref 0.0–0.1)
Basophils Relative: 0 %
Eosinophils Absolute: 0.1 K/uL (ref 0.0–0.5)
Eosinophils Relative: 1 %
HCT: 35 % — ABNORMAL LOW (ref 39.0–52.0)
Hemoglobin: 11.8 g/dL — ABNORMAL LOW (ref 13.0–17.0)
Immature Granulocytes: 1 %
Lymphocytes Relative: 8 %
Lymphs Abs: 1.2 K/uL (ref 0.7–4.0)
MCH: 33.3 pg (ref 26.0–34.0)
MCHC: 33.7 g/dL (ref 30.0–36.0)
MCV: 98.9 fL (ref 80.0–100.0)
Monocytes Absolute: 1.1 K/uL — ABNORMAL HIGH (ref 0.1–1.0)
Monocytes Relative: 8 %
Neutro Abs: 11.5 K/uL — ABNORMAL HIGH (ref 1.7–7.7)
Neutrophils Relative %: 82 %
Platelets: 479 K/uL — ABNORMAL HIGH (ref 150–400)
RBC: 3.54 MIL/uL — ABNORMAL LOW (ref 4.22–5.81)
RDW: 11.9 % (ref 11.5–15.5)
WBC: 14.1 K/uL — ABNORMAL HIGH (ref 4.0–10.5)
nRBC: 0 % (ref 0.0–0.2)

## 2024-06-14 LAB — BASIC METABOLIC PANEL WITH GFR
Anion gap: 8 (ref 5–15)
BUN: 12 mg/dL (ref 6–20)
CO2: 24 mmol/L (ref 22–32)
Calcium: 8.4 mg/dL — ABNORMAL LOW (ref 8.9–10.3)
Chloride: 102 mmol/L (ref 98–111)
Creatinine, Ser: 0.85 mg/dL (ref 0.61–1.24)
GFR, Estimated: >60 mL/min (ref >60)
Glucose, Bld: 113 mg/dL — ABNORMAL HIGH (ref 70–99)
Potassium: 4.2 mmol/L (ref 3.5–5.1)
Sodium: 134 mmol/L — ABNORMAL LOW (ref 135–145)

## 2024-06-14 LAB — FERRITIN: Ferritin: 507 ng/mL — ABNORMAL HIGH (ref 24–336)

## 2024-06-14 MED ORDER — DIAZEPAM 5 MG/ML IJ SOLN
2.5000 mg | Freq: Once | INTRAMUSCULAR | Status: AC | PRN
  Administered 2024-06-18: 2.5 mg via INTRAVENOUS
  Filled 2024-06-14: qty 2

## 2024-06-14 NOTE — Assessment & Plan Note (Signed)
 Present on admission with leukocytosis, tachycardia and psoas muscle abscess.  Interventional radiology did a drainage procedure on 8/18.  Staph aureus growing out of culture and sensitivities pending.  Continue IV vancomycin .  Will get ID consultation.

## 2024-06-14 NOTE — Plan of Care (Signed)
   Problem: Education: Goal: Knowledge of General Education information will improve Description Including pain rating scale, medication(s)/side effects and non-pharmacologic comfort measures Outcome: Progressing   Problem: Health Behavior/Discharge Planning: Goal: Ability to manage health-related needs will improve Outcome: Progressing

## 2024-06-14 NOTE — Assessment & Plan Note (Signed)
 Hemoglobin 11.8 with ferritin of 507.

## 2024-06-14 NOTE — Progress Notes (Addendum)
 Progress Note   Patient: Craig Doyle FMW:[id number] DOB: 04-17-1966 DOA: 06/12/2024     1 DOS: the patient was seen and examined on 06/14/2024   Brief hospital course: 58 y.o. male with medical history significant for Tobacco use disorder being admitted with a right psoas abscess.  He presented with a 9-day history of right lower abdominal and low back pain and intermittent fevers.  He denies dysuria, change in bowel habits, nausea or vomiting In the ED he was tachycardic to 116 with otherwise unremarkable vitals.  Labs notable for WBC 18,000, lactic acid pending EKG. showed NSR at 88 CT abdomen and pelvis showing right psoas muscle abscess the ED provider spoke with IR who will provide drainage in the a.m. Patient started on Zosyn  and given a fluid bolus and pain control Admission requested   8/18.  Case discussed with interventional radiology and they will bring down for drainage of abscess.  Continue IV antibiotics. 8/19.  Staphylococcus aureus growing out of culture.  Sensitivities pending.  Continue IV vancomycin  and get rid of Zosyn .  Assessment and Plan: * Staphylococcus aureus sepsis (HCC) Present on admission with leukocytosis, tachycardia and psoas muscle abscess.  Interventional radiology did a drainage procedure on 8/18.  Staph aureus growing out of culture and sensitivities pending.  Continue IV vancomycin .  Will get ID consultation.  Psoas abscess, right (HCC) Continue IV vancomycin .  Will discontinue Zosyn  since culture growing Staph aureus.  Await sensitivities.  Hypotension Improved with fluid bolus  Thrombocytosis Secondary to sepsis  Hyponatremia Last sodium 134  Anemia of chronic disease Hemoglobin 11.8 with ferritin of 507.        Subjective: Patient is still having pain in his right back.  Had drainage procedure yesterday by interventional radiology.  Staph aureus growing out of the culture.  Physical Exam: Vitals:   06/13/24 2051 06/13/24 2309  06/13/24 2322 06/14/24 0245  BP: (!) 99/54 (!) 106/54 119/69 133/70  Pulse: 61 (!) 56 (!) 57 79  Resp: 15  17 16   Temp: 97.9 F (36.6 C)  98.4 F (36.9 C) 97.7 F (36.5 C)  TempSrc: Oral   Oral  SpO2: 97%  99% 97%  Weight:      Height:       Physical Exam HENT:     Head: Normocephalic.     Mouth/Throat:     Pharynx: No oropharyngeal exudate.  Eyes:     General: Lids are normal.     Conjunctiva/sclera: Conjunctivae normal.  Cardiovascular:     Rate and Rhythm: Normal rate and regular rhythm.     Heart sounds: Normal heart sounds, S1 normal and S2 normal.  Pulmonary:     Breath sounds: No decreased breath sounds, wheezing, rhonchi or rales.  Abdominal:     Palpations: Abdomen is soft.     Tenderness: There is abdominal tenderness in the right lower quadrant.     Comments: Right flank pain  Musculoskeletal:     Right lower leg: No swelling.     Left lower leg: No swelling.  Skin:    General: Skin is warm.     Findings: No rash.  Neurological:     Mental Status: He is alert and oriented to person, place, and time.     Comments: Able to straight leg raise     Data Reviewed: Staph aureus growing out of culture.  Sensitivities still pending Sodium 134, creatinine 0.85, BNP 10/31/2005, white blood count 14.1, hemoglobin 11.8, platelet count 479  Family  Communication: Declined  Disposition: Status is: Inpatient Remains inpatient appropriate because: Continue IV antibiotics.  Continue drainage tube.  Planned Discharge Destination: Home    Time spent: 28 minutes  Author: Charlie Patterson, MD 06/14/2024 3:17 PM  For on call review [email or web address].

## 2024-06-14 NOTE — Consult Note (Signed)
 NAME: Craig Doyle  DOB: 11/12/1965  MRN: [id number]  Date/Time: 06/14/2024 2:14 PM  REQUESTING PROVIDER: Dr.Wieting Subjective:  REASON FOR CONSULT: Psoas abscess ?PT is a limited historian Craig Doyle is a 58 y.o. male with a history of substance use and alcohol  use presented to the ED on 06/12/2024 brought in by EMS from a gas station.  As per the note patient had walked to the gas station but was having trouble walking and hence called EMS.  He says patient stays he has been unable to walk for the past 3 weeks almost doubled down when walking with pain in the lower abdomen on the right side and the flank area.  He also had some trouble urinating.  He denies any fever, trauma, skin wounds He says he lives by himself Vitals in the ED BP of 152/107, temperature 98, pulse 116, respiratory rate 20 and sats of 97%  Labs revealed WBC of 18.7, platelet of 596, Hb of 15.2 and creatinine of 0.80. Blood culture sent He was started on vancomycin  and Zosyn  He had a CT of the abdomen and pelvis which showed a right psoas abscess I interventional radiology placed a drain in the right psoas collection and sent the fluid for culture on 06/13/2024 I am asked to  see the patient for the same  Past medical history Alcohol  abuse Admission to the behavioral unit in 2012  Past surgical history none Social History   Socioeconomic History   Marital status: Single    Spouse name: Not on file   Number of children: Not on file   Years of education: Not on file   Highest education level: Not on file  Occupational History   Not on file  Tobacco Use   Smoking status: Every Day    Current packs/day: 1.00    Average packs/day: 0.6 packs/day for 45.0 years (25.8 ttl pk-yrs)    Types: Cigarettes    Start date: 06/13/1979    Passive exposure: Current   Smokeless tobacco: Never  Vaping Use   Vaping status: Never Used  Substance and Sexual Activity   Alcohol  use: Not Currently    Comment: 2 40's a day,  none since 9 days   Drug use: Not Currently   Sexual activity: Not Currently  Other Topics Concern   Not on file  Social History Narrative   Not on file   Social Drivers of Health   Financial Resource Strain: Not on file  Food Insecurity: Food Insecurity Present (06/12/2024)   Hunger Vital Sign    Worried About Running Out of Food in the Last Year: Often true    Ran Out of Food in the Last Year: Often true  Transportation Needs: Unmet Transportation Needs (06/12/2024)   PRAPARE - Administrator, Civil Service (Medical): Yes    Lack of Transportation (Non-Medical): Yes  Physical Activity: Not on file  Stress: Not on file  Social Connections: Not on file  Intimate Partner Violence: Not At Risk (06/12/2024)   Humiliation, Afraid, Rape, and Kick questionnaire    Fear of Current or Ex-Partner: No    Emotionally Abused: No    Physically Abused: No    Sexually Abused: No    History reviewed. No pertinent family history. No Known Allergies I? Current Facility-Administered Medications  Medication Dose Route Frequency Provider Last Rate Last Admin   0.9 %  sodium chloride  infusion   Intravenous Continuous Josette Ade, MD 100 mL/hr at 06/13/24 1745 New Bag  at 06/13/24 1745   acetaminophen  (TYLENOL ) tablet 650 mg  650 mg Oral Q6H PRN Cleatus Delayne GAILS, MD       Or   acetaminophen  (TYLENOL ) suppository 650 mg  650 mg Rectal Q6H PRN Cleatus Delayne GAILS, MD       ketorolac  (TORADOL ) 30 MG/ML injection 30 mg  30 mg Intravenous Q6H PRN Cleatus Delayne GAILS, MD   30 mg at 06/14/24 0254   morphine  (PF) 2 MG/ML injection 2 mg  2 mg Intravenous Q2H PRN Cleatus Delayne GAILS, MD   2 mg at 06/13/24 2327   nicotine  (NICODERM CQ  - dosed in mg/24 hours) patch 14 mg  14 mg Transdermal Daily Cleatus Delayne GAILS, MD   14 mg at 06/14/24 9075   ondansetron  (ZOFRAN ) tablet 4 mg  4 mg Oral Q6H PRN Cleatus Delayne GAILS, MD       Or   ondansetron  (ZOFRAN ) injection 4 mg  4 mg Intravenous Q6H PRN Cleatus Delayne GAILS, MD        oxyCODONE  (Oxy IR/ROXICODONE ) immediate release tablet 5 mg  5 mg Oral Q4H PRN Cleatus Delayne GAILS, MD   5 mg at 06/13/24 1741   piperacillin -tazobactam (ZOSYN ) IVPB 3.375 g  3.375 g Intravenous Q8H Rojelio Alan HERO, RPH 12.5 mL/hr at 06/14/24 1300 3.375 g at 06/14/24 1300   sodium chloride  flush (NS) 0.9 % injection 10 mL  10 mL Intracatheter Q8H Karalee Wilkie POUR, MD   10 mL at 06/14/24 0557   vancomycin  (VANCOREADY) IVPB 750 mg/150 mL  750 mg Intravenous Q12H Rojelio Alan HERO, RPH 150 mL/hr at 06/14/24 1031 750 mg at 06/14/24 1031     Abtx:  Anti-infectives (From admission, onward)    Start     Dose/Rate Route Frequency Ordered Stop   06/13/24 1030  vancomycin  (VANCOREADY) IVPB 750 mg/150 mL        750 mg 150 mL/hr over 60 Minutes Intravenous Every 12 hours 06/12/24 2227 06/20/24 1029   06/13/24 0600  piperacillin -tazobactam (ZOSYN ) IVPB 3.375 g        3.375 g 12.5 mL/hr over 240 Minutes Intravenous Every 8 hours 06/12/24 2215     06/12/24 2230  vancomycin  (VANCOREADY) IVPB 1500 mg/300 mL        1,500 mg 150 mL/hr over 120 Minutes Intravenous  Once 06/12/24 2217 06/13/24 0222   06/12/24 2115  piperacillin -tazobactam (ZOSYN ) IVPB 3.375 g        3.375 g 12.5 mL/hr over 240 Minutes Intravenous  Once 06/12/24 2104 06/12/24 2347       REVIEW OF SYSTEMS:  Const: negative fever, negative chills, negative weight loss Eyes: negative diplopia or visual changes, negative eye pain ENT: negative coryza, negative sore throat Resp: negative cough, hemoptysis, dyspnea Cards: negative for chest pain, palpitations, lower extremity edema GU: Difficult passing urine GI: Right lower quadrant abdominal pain,  Skin: negative for rash and pruritus Heme: negative for easy bruising and gum/nose bleeding MS: Right back pain  Neurolo:negative for headaches, dizziness, vertigo, memory problems  Psych: anxiety, depression  Endocrine: negative for thyroid, diabetes Allergy/Immunology- negative for any  medication or food allergies ?  Objective:  VITALS:  BP 133/70   Pulse 79   Temp 97.7 F (36.5 C) (Oral)   Resp 16   Ht 6' 1 (1.854 m)   Wt 59.7 kg   SpO2 97%   BMI 17.36 kg/m  LDA Foley Central line Other drainage tubes PHYSICAL EXAM:  General: Alert, cooperative, no distress, appears stated age.  Head: Normocephalic, without obvious abnormality, atraumatic. Eyes: Conjunctivae clear, anicteric sclerae. Pupils are equal ENT Nares normal. No drainage or sinus tenderness. Lips, mucosa, and tongue normal. No Thrush Neck: Supple, symmetrical, no adenopathy, thyroid: non tender no carotid bruit and no JVD. Back: No CVA tenderness. Lungs: Clear to auscultation bilaterally. No Wheezing or Rhonchi. No rales. Heart: Regular rate and rhythm, no murmur, rub or gallop. Abdomen: Soft, non-tender,not distended. Bowel sounds normal. No masses Right flank area drain present reddish pus Extremities: no obvious wounds Toe nails especially of great toe abnormal Skin: No rashes or lesions. Or bruising Lymph: Cervical, supraclavicular normal. Neurologic: Grossly non-focal Pertinent Labs Lab Results CBC    Component Value Date/Time   WBC 14.1 (H) 06/14/2024 0322   RBC 3.54 (L) 06/14/2024 0322   HGB 11.8 (L) 06/14/2024 0322   HGB 15.7 10/15/2012 0205   HCT 35.0 (L) 06/14/2024 0322   HCT 44.2 10/15/2012 0205   PLT 479 (H) 06/14/2024 0322   PLT 266 10/15/2012 0205   MCV 98.9 06/14/2024 0322   MCV 96 10/15/2012 0205   MCH 33.3 06/14/2024 0322   MCHC 33.7 06/14/2024 0322   RDW 11.9 06/14/2024 0322   RDW 13.5 10/15/2012 0205   LYMPHSABS 1.2 06/14/2024 0322   MONOABS 1.1 (H) 06/14/2024 0322   EOSABS 0.1 06/14/2024 0322   BASOSABS 0.1 06/14/2024 0322       Latest Ref Rng & Units 06/14/2024    3:22 AM 06/13/2024    6:04 AM 06/12/2024    6:24 PM  CMP  Glucose 70 - 99 mg/dL 886  99  828   BUN 6 - 20 mg/dL 12  11  9    Creatinine 0.61 - 1.24 mg/dL 9.14  9.03  9.19   Sodium 135 - 145  mmol/L 134  138  134   Potassium 3.5 - 5.1 mmol/L 4.2  3.8  4.6   Chloride 98 - 111 mmol/L 102  102  98   CO2 22 - 32 mmol/L 24  25  25    Calcium 8.9 - 10.3 mg/dL 8.4  8.6  9.2       Microbiology: Recent Results (from the past 240 hours)  Urine Culture     Status: Abnormal   Collection Time: 06/12/24  6:24 PM   Specimen: Urine, Clean Catch  Result Value Ref Range Status   Specimen Description   Final    URINE, CLEAN CATCH Performed at [facility], [address], [place], [place] [address]    Special Requests   Final    NONE Performed at [facility], [address] [place], [place] [address]    Culture (A)  Final    <10,000 COLONIES/mL INSIGNIFICANT GROWTH Performed at [facility], [address], [place], [place] [address]    Report Status 06/14/2024 FINAL  Final  Culture, blood (Routine X 2) w Reflex to ID Panel     Status: None (Preliminary result)   Collection Time: 06/12/24 11:27 PM   Specimen: BLOOD  Result Value Ref Range Status   Specimen Description BLOOD BLOOD RIGHT FOREARM  Final   Special Requests   Final    BOTTLES DRAWN AEROBIC AND ANAEROBIC Blood Culture adequate volume   Culture   Final    NO GROWTH 1 DAY Performed at [facility], [address], [place], [place] [address]    Report Status PENDING  Incomplete  Culture, blood (Routine X 2) w Reflex to ID Panel  Status: None (Preliminary result)   Collection Time: 06/12/24 11:27 PM   Specimen: BLOOD  Result Value Ref Range Status   Specimen Description BLOOD BLOOD RIGHT WRIST  Final   Special Requests   Final    BOTTLES DRAWN AEROBIC AND ANAEROBIC Blood Culture adequate volume   Culture   Final    NO GROWTH 1 DAY Performed at [facility], [address] [place], [place] [address]    Report Status PENDING  Incomplete  Aerobic/Anaerobic Culture w Gram Stain (surgical/deep wound)     Status: None (Preliminary result)   Collection Time: 06/13/24   4:42 PM   Specimen: Abscess  Result Value Ref Range Status   Specimen Description   Final    ABSCESS Performed at [facility], [address] [place], [place] [address]    Special Requests NONE  Final   Gram Stain MODERATE WBC SEEN MODERATE GRAM POSITIVE COCCI   Final   Culture   Final    MODERATE STAPHYLOCOCCUS AUREUS CULTURE REINCUBATED FOR BETTER GROWTH Performed at [facility], [address], [place], [place] [address]    Report Status PENDING  Incomplete      IMAGING RESULTS: CT abdomen and pelvis  Rt psoas abscess   Rt psoas abscess   I have personally reviewed the films ? Impression/Recommendation ? Right psoas abscess secondary to Staph aureus Susceptibility pending Patient has a drain placed in the right side The source of the psoas abscess is unclear Need to get MRI of the thoracolumbar spine to look for any discitis/vertebral osteomyelitis Blood cultures pending Will order a 2D echo to look for valves vegetation Patient is currently on vancomycin  and Zosyn .  Can discontinue Zosyn  Depending on the susceptibility we can adjust the staphcoccus antibiotic  History of substance use Will get a urine tox screen  Leukocytosis and thrombocytosis secondary to the above infection  Will get LFTs HIV nonreactive  This consultation involved complex antimicrobial management I have personally spent  70---minutes involved in face-to-face and non-face-to-face activities for this patient on the day of the visit. Professional time spent includes the following activities: Preparing to see the patient (review of tests), Obtaining and/or reviewing separately obtained history (admission/discharge record), Performing a medically appropriate examination and/or evaluation , Ordering medications/tests/procedures, referring and communicating with other health care professionals, Documenting clinical information in the EMR, Independently interpreting results (not  separately reported), Communicating results to the patient/family/caregiver, Counseling and educating the patient/family/caregiver and Care coordination (not separately reported).    ________________________________________________  Note:  This document was prepared using Conservation officer, historic buildings and may include unintentional dictation errors.

## 2024-06-15 ENCOUNTER — Inpatient Hospital Stay (HOSPITAL_COMMUNITY)
Admit: 2024-06-15 | Discharge: 2024-06-15 | Disposition: A | Discharge status: Home / Self Care | Payer: Self-pay | Attending: Infectious Diseases | Admitting: Infectious Diseases

## 2024-06-15 ENCOUNTER — Inpatient Hospital Stay: Payer: Self-pay

## 2024-06-15 DIAGNOSIS — A4901 Methicillin susceptible Staphylococcus aureus infection, unspecified site: Secondary | ICD-10-CM

## 2024-06-15 DIAGNOSIS — E8809 Other disorders of plasma-protein metabolism, not elsewhere classified: Secondary | ICD-10-CM

## 2024-06-15 DIAGNOSIS — I38 Endocarditis, valve unspecified: Secondary | ICD-10-CM

## 2024-06-15 LAB — URINE DRUG SCREEN, QUALITATIVE (ARMC ONLY)
Amphetamines, Ur Screen: NOT DETECTED
Barbiturates, Ur Screen: NOT DETECTED
Benzodiazepine, Ur Scrn: NOT DETECTED
Cannabinoid 50 Ng, Ur ~~LOC~~: NOT DETECTED
Cocaine Metabolite,Ur ~~LOC~~: NOT DETECTED
MDMA (Ecstasy)Ur Screen: NOT DETECTED
Methadone Scn, Ur: NOT DETECTED
Opiate, Ur Screen: NOT DETECTED
Phencyclidine (PCP) Ur S: NOT DETECTED
Tricyclic, Ur Screen: NOT DETECTED

## 2024-06-15 LAB — C-REACTIVE PROTEIN: CRP: 13 mg/dL — ABNORMAL HIGH (ref <1.0)

## 2024-06-15 LAB — SEDIMENTATION RATE: Sed Rate: 58 mm/h — ABNORMAL HIGH (ref 0–20)

## 2024-06-15 MED ORDER — ENSURE PLUS HIGH PROTEIN PO LIQD
237.0000 mL | Freq: Two times a day (BID) | ORAL | Status: DC
  Administered 2024-06-15 – 2024-06-22 (×13): 237 mL via ORAL

## 2024-06-15 MED ORDER — POLYETHYLENE GLYCOL 3350 17 G PO PACK
17.0000 g | PACK | Freq: Every day | ORAL | Status: DC
  Administered 2024-06-15 – 2024-06-22 (×5): 17 g via ORAL
  Filled 2024-06-15 (×8): qty 1

## 2024-06-15 MED ORDER — SENNA 8.6 MG PO TABS
1.0000 | ORAL_TABLET | Freq: Every day | ORAL | Status: DC
  Administered 2024-06-15 – 2024-06-19 (×5): 8.6 mg via ORAL
  Filled 2024-06-15 (×8): qty 1

## 2024-06-15 NOTE — Plan of Care (Signed)
  Problem: Clinical Measurements: Goal: Ability to maintain clinical measurements within normal limits will improve Outcome: Progressing   Problem: Activity: Goal: Risk for activity intolerance will decrease Outcome: Progressing   Problem: Coping: Goal: Level of anxiety will decrease Outcome: Progressing   Problem: Pain Managment: Goal: General experience of comfort will improve and/or be controlled Outcome: Progressing   Problem: Safety: Goal: Ability to remain free from injury will improve Outcome: Progressing

## 2024-06-15 NOTE — Progress Notes (Signed)
 Referral to see patient received from [facility]. Met with patient at bedside. Introduced to role of Statistician. Intake questions completed.   Patient released from prison in January 2025, following a 17 year stint. Original home plan was to live with his sister, Craig Doyle. However, patient states they got into an argument and he moved out. Since then, patient has been homeless. Currently he is living in an abandoned home. He DOES receive food stamps and states he keeps his food cool in coolers filled with ice. He typically grills his food over a fire. He sources water from an outside source for bathing in the tub. Uses public transportation and/or walks to get to/from destinations.   Patient has no steady income. 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 Patient is interested in employment. Provided with information for [employer]. 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   He denies drug use, stating he hasn't used drugs in about 25 years. He does endorse occasional alcohol  intake.   Patient is aware he will need PCP follow up, but does NOT currently have insurance. Referral made to financial by TOC. Patient also requesting mental health provider--will refer to [facility] given no insurance status.   Currently patient is post-release, assigned to Officer Beverley Saber [phone]). Number provided to patient to contact officer. Will also send written documentation of hospitalization, per request.  Patient encouraged to call with questions or concerns.

## 2024-06-15 NOTE — TOC Initial Note (Signed)
 Transition of Care [facility]) - Initial/Assessment Note    Patient Details  Name: Craig Doyle MRN: [id number] Date of Birth: [birth year]  [facility] CM/SW Contact:    Craig Cooks, RN Phone Number: 06/15/2024, 8:49 AM  Clinical Narrative:                  This CM spoke with pt introduced role and  completed Initial assessement. Pt A&Ox 4 and reports living in a abandon house out in the woods in [place] for the last 6 months  .Pt further conveyed he has been incarcerated since 2001 until January of this yr when he ws released from jail  and is out on Probation. Pt informed his probation officer name is Craig Doyle located in the [place] area he did not give a 1st name. Pt further conveyed he does not have his phone and all his personal contacts got wet preventing him from being able to alert  anyone of his current hospital admission . Pt confirmed he does not have insurance , he has a sister named Craig Doyle that lives in [place] that he has some  relationship with , and  shared she provides some support. 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 Pt shared he does not go to the doctors and that he's been trying to get his Medicare / Disability coordinated . This CM  alerted Craig Doyle per the guidance of my covering manager to see if she could assist with locating pt's probation officer to obtain contact information . This CM also alerted POC [employer] in finance dept to assist with starting process for emergency Medicaid, rec'd update from [name] process started for pt's Medicaid screening.  Pt  does not uses a pharmacy or  have DME. Pt denies being established with any  community resources . TOC  will cont to follow DC planning / Care coordination  during pt's  hospital stay and update as applicable.        Patient Goals and CMS  Choice            Expected Discharge Plan and Services                                              Prior Living Arrangements/Services                       Activities of Daily Living   ADL Screening (condition at time of admission) Independently performs ADLs?: Yes (appropriate for developmental age) Is the patient deaf or have difficulty hearing?: No Does the patient have difficulty seeing, even when wearing glasses/contacts?: No Does the patient have difficulty concentrating, remembering, or making decisions?: No  Permission Sought/Granted                  Emotional Assessment              Admission diagnosis:  Psoas abscess, right (HCC) [K68.12] Abscess [L02.91] Patient Active Problem List   Diagnosis Date Noted   Staphylococcus aureus sepsis (HCC) 06/14/2024   Anemia of chronic disease 06/14/2024   Hyponatremia 06/13/2024   Thrombocytosis 06/13/2024   Hypotension 06/13/2024   Psoas abscess, right (HCC)  06/12/2024   PCP:  Craig Doyle, No Pharmacy:   [facility] [address], [place] - [name] [place] [address] Phone: [phone] Fax: [phone]     Social Drivers of Health (SDOH) Social History: SDOH Screenings   Food Insecurity: Food Insecurity Present (06/12/2024)  Housing: High Risk (06/12/2024)  Transportation Needs: Unmet Transportation Needs (06/12/2024)  Utilities: At Risk (06/12/2024)  Tobacco Use: High Risk (06/12/2024)   SDOH Interventions:     Readmission Risk Interventions     No data to display

## 2024-06-15 NOTE — Progress Notes (Signed)
 Date of Admission:  06/12/2024     ID: Craig Doyle is a 58 y.o. male Principal Problem:   Staphylococcus aureus sepsis (HCC) Active Problems:   Psoas abscess, right (HCC)   Hyponatremia   Thrombocytosis   Hypotension   Anemia of chronic disease    Subjective: Still has some pain rt flank Couldn't get MRI because of metal in the JP drain  Medications:   nicotine   14 mg Transdermal Daily   sodium chloride  flush  10 mL Intracatheter Q8H    Objective: Vital signs in last 24 hours: Patient Vitals for the past 24 hrs:  BP Temp Temp src Pulse Resp SpO2  06/15/24 0910 105/63 98.1 F (36.7 C) Oral 64 16 100 %  06/15/24 0355 97/78 98.2 F (36.8 C) -- 64 18 97 %  06/15/24 0004 111/62 98.7 F (37.1 C) -- 61 18 99 %  06/14/24 1921 (!) 105/58 97.7 F (36.5 C) -- 60 18 99 %  06/14/24 1537 (!) 110/57 97.9 F (36.6 C) -- 61 16 100 %     Lines and Device Date on insertion # of days DC  Chiropractor     Foley     ETT       PHYSICAL EXAM:  General: Alert, cooperative, no distress, appears stated age.  Lungs: Clear to auscultation bilaterally. No Wheezing or Rhonchi. No rales. Heart: Regular rate and rhythm, no murmur, rub or gallop. Abdomen: Soft, non-tender,not distended. Bowel sounds normal. No masses Rt flank area drain present- reddish pus in bulb Extremities: atraumatic, no cyanosis. No edema. No clubbing Skin: No rashes or lesions. Or bruising Lymph: Cervical, supraclavicular normal. Neurologic: Grossly non-focal  Lab Results    Latest Ref Rng & Units 06/14/2024    3:22 AM 06/13/2024    6:04 AM 06/12/2024    6:24 PM  CBC  WBC 4.0 - 10.5 K/uL 14.1  16.7  18.7   Hemoglobin 13.0 - 17.0 g/dL 88.1  87.5  84.7   Hematocrit 39.0 - 52.0 % 35.0  35.1  42.3   Platelets 150 - 400 K/uL 479  488  596        Latest Ref Rng & Units 06/14/2024    3:22 AM 06/13/2024    6:04 AM 06/12/2024    6:24 PM  CMP  Glucose 70 - 99 mg/dL 886  99  828   BUN 6 - 20 mg/dL  12  11  9    Creatinine 0.61 - 1.24 mg/dL 9.14  9.03  9.19   Sodium 135 - 145 mmol/L 134  138  134   Potassium 3.5 - 5.1 mmol/L 4.2  3.8  4.6   Chloride 98 - 111 mmol/L 102  102  98   CO2 22 - 32 mmol/L 24  25  25    Calcium 8.9 - 10.3 mg/dL 8.4  8.6  9.2   Total Protein 6.5 - 8.1 g/dL 5.4     Total Bilirubin 0.0 - 1.2 mg/dL 0.6     Alkaline Phos 38 - 126 U/L 66     AST 15 - 41 U/L 29     ALT 0 - 44 U/L 24         Microbiology: Abscess culture MSSA  Studies/Results: CT GUIDED PERITONEAL/RETROPERITONEAL FLUID DRAIN BY PERC CATH Result Date: 06/13/2024 INDICATION: 58 year old male with right retroperitoneal abscess. He presents for CT-guided drain placement. EXAM: CT-guided drain placement into right retroperitoneal abscess TECHNIQUE: Multidetector CT imaging of the abdomen was  performed following the standard protocol without IV contrast. RADIATION DOSE REDUCTION: This exam was performed according to the departmental dose-optimization program which includes automated exposure control, adjustment of the mA and/or kV according to patient size and/or use of iterative reconstruction technique. MEDICATIONS: The patient is currently admitted to the hospital and receiving intravenous antibiotics. The antibiotics were administered within an appropriate time frame prior to the initiation of the procedure. ANESTHESIA/SEDATION: Moderate (conscious) sedation was employed during this procedure. A total of Versed  2 mg and Fentanyl  100 mcg was administered intravenously by the radiology nurse. Total intra-service moderate Sedation Time: 15 minutes. The patient's level of consciousness and vital signs were monitored continuously by radiology nursing throughout the procedure under my direct supervision. COMPLICATIONS: None immediate. PROCEDURE: Informed written consent was obtained from the patient after a thorough discussion of the procedural risks, benefits and alternatives. All questions were addressed. Maximal  Sterile Barrier Technique was utilized including caps, mask, sterile gowns, sterile gloves, sterile drape, hand hygiene and skin antiseptic. A timeout was performed prior to the initiation of the procedure. A planning CT scan was performed. The complex fluid collection in the right retroperitoneal space located along the anterior aspect of the psoas muscle and just posterior to the perinephric space was successfully identified. A skin entry site was selected and marked. Local anesthesia was attained by infiltration with 1% lidocaine. A small dermatotomy was made. Under intermittent CT guidance, an 18 gauge trocar needle was advanced into the collection. A 0.035 wire was then coiled within the collection. The percutaneous tract was dilated to 12 [place]. A 12 French drainage catheter was advanced over the wire and formed. Aspiration yields 35 mL thick bloody purulent fluid. Samples were sent for Gram stain and culture. The abscess cavity was then lavaged with sterile saline and the drain was connected to JP bulb suction. The drain was secured to the skin with 0 Prolene suture. Sterile bandages were applied. Post placement CT imaging demonstrates a well-positioned drainage catheter and no evidence of complication. IMPRESSION: Successful placement of 12 French drainage catheter into right retroperitoneal abscess collection yielding 35 mL thick purulent fluid. Electronically Signed   By: Wilkie Lent M.D.   On: 06/13/2024 17:10     Assessment/Plan: Right psoas abscess secondary to Staph aureus Susceptibility pending Patient has a drain placed by IR The source of the psoas abscess is unclear Need to get MRI of the thoracolumbar spine to look for any discitis/vertebral osteomyelitis- but because of small metal in JP drain he felt a magnetic pull and the waiting on IR to change the drain before MRI can be done Blood culture neg so far Will order a 2D echo to look for valves vegetation Patient is currently  on vancomycin   Depending on the susceptibility we can adjust the staphcoccus antibiotic   History of substance use Will get a urine tox screen   Leukocytosis and thrombocytosis secondary to the above infection  Hypoalbuminemia  HIV non reactive  Discussed the management with patient and his nurse

## 2024-06-15 NOTE — Progress Notes (Signed)
 Progress Note   Patient: Craig Doyle FMW:[id number] DOB: 11/18/1965 DOA: 06/12/2024     2 DOS: the patient was seen and examined on 06/15/2024   Brief hospital course: 58 y.o. male with medical history significant for Tobacco use disorder being admitted with a right psoas abscess.  He presented with a 9-day history of right lower abdominal and low back pain and intermittent fevers.  He denies dysuria, change in bowel habits, nausea or vomiting In the ED he was tachycardic to 116 with otherwise unremarkable vitals.  Labs notable for WBC 18,000, lactic acid pending EKG. showed NSR at 88 CT abdomen and pelvis showing right psoas muscle abscess the ED provider spoke with IR who will provide drainage in the a.m. Patient started on Zosyn  and given a fluid bolus and pain control Admission requested   8/18.  Case discussed with interventional radiology and they will bring down for drainage of abscess.  Continue IV antibiotics. 8/19.  Staphylococcus aureus growing out of culture.  Sensitivities pending.  Continue IV vancomycin  and get rid of Zosyn .  Assessment and Plan: * Staphylococcus aureus sepsis (HCC)  Sepsis physiology is resolved. Present on admission with leukocytosis, tachycardia and psoas muscle abscess.  Status post drainage with IR on 8/18.  Staph aureus growing out of culture and sensitivities pending.  Continue IV vancomycin .  ID recommending TTE and MRI of T and L-spine.  Patient had some discomfort at drainage site when bedside screening recommended for MRI done.  Patient is currently refusing MRI. - Continue antibiotics, with vancomycin  -Follow-up ID recommendations  Psoas abscess, right (HCC) Continue IV vancomycin .    Hypotension resolved Improved with IV fluids.    Thrombocytosis Likely elevated due to sepsis.    Latest Ref Rng & Units 06/14/2024    3:22 AM 06/13/2024    6:04 AM 06/12/2024    6:24 PM  CBC  WBC 4.0 - 10.5 K/uL 14.1  16.7  18.7   Hemoglobin 13.0 - 17.0  g/dL 88.1  87.5  84.7   Hematocrit 39.0 - 52.0 % 35.0  35.1  42.3   Platelets 150 - 400 K/uL 479  488  596   Continue to monitor   Hyponatremia Mild, last Na 134. Unclear etiology.  Repeat BMP in the AM   Anemia of chronic disease Hemoglobin 11.8 with ferritin of 507. -F/u serum iron and TIBC, folate, B12   Malnutrition BMI 17.36.  Consult dietitian.   Tobacco use disorder Continue nicotine  patch     Subjective: Denies pain this AM. States he did have some discomfort when hand-held magnet was placed over JP drain.  Because of this he is refusing to get MRI of his T and L-spine.  Physical Exam: Vitals:   06/14/24 1921 06/15/24 0004 06/15/24 0355 06/15/24 0910  BP: (!) 105/58 111/62 97/78 105/63  Pulse: 60 61 64 64  Resp: 18 18 18 16   Temp: 97.7 F (36.5 C) 98.7 F (37.1 C) 98.2 F (36.8 C) 98.1 F (36.7 C)  TempSrc:    Oral  SpO2: 99% 99% 97% 100%  Weight:      Height:       Physical Exam  Constitutional: In no distress. Cachectic appearing.  Cardiovascular: Normal rate, regular rhythm. No lower extremity edema  Pulmonary: Non labored breathing on room air, no wheezing or rales.   Abdominal: Soft. Normal bowel sounds. Non distended and non tender Musculoskeletal: Normal range of motion.     Neurological: Alert and oriented to person, place, and  time. Non focal  Skin: Skin is warm and dry.    Data Reviewed: Staph aureus growing out of culture.  Sensitivities still pending ESR 58, crp 13  Family Communication: Declined  Disposition: Status is: Inpatient Remains inpatient appropriate because: Continue IV antibiotics.  Continue JP drain   Planned Discharge Destination: Home    Time spent: 35 minutes  Author: Alban Pepper, MD 06/15/2024 2:32 PM  For on call review [email or web address].

## 2024-06-15 NOTE — Evaluation (Signed)
 Physical Therapy Evaluation Patient Details Name: Craig Doyle MRN: [id number] DOB: Jun 26, 1966 Today's Date: 06/15/2024  History of Present Illness  Pt admitted to [facility] on 06/12/24 for R groin pain and edema, found to have R psoas abscess secondary to staph aureus. S/p drainage by IR on 8/18. Significant PMH includes: tobacco use.  Clinical Impression  Pt received in sidelying and is agreeable for PT eval despite 7/10 pain at JP drain site. At baseline, pt is IND with ADL's, IADL's, and ambulation without AD. He does not have running water in the home, but has access to water;toilets outside the home.  Pt currently near baseline and has not experienced a decline in function as a result of this hospital admission. They are mod I for bed mobility, transfers, and ambulation in room with UE support on IV pole. Pt states he has been IND for amb in room since admission, and does not feel he needs acute/post acute therapy services.  Due to pt being at baseline without acute deficits, they are not appropriate for skilled acute PT services at this time. PT to sign off. Please re-consult as appropriate.  He continues to be appropriate for IND ambulation in room, and would benefit from hallway ambulation with nursing/mobility tech for maintenance of IND with functional mobility. Pt verbalized understanding.               Equipment Recommendations BSC/3in1     Functional Status Assessment Patient has not had a recent decline in their functional status     Precautions / Restrictions Precautions Precaution/Restrictions Comments: SpO2 >/= 92%; JP drain      Mobility  Bed Mobility Overal bed mobility: Modified Independent             General bed mobility comments: HOB elevated, use of BUE for support, increased time/effort    Transfers Overall transfer level: Modified independent                 General transfer comment: STS from EOB and commode with UE support, no AD     Ambulation/Gait   Gait Distance (Feet): 25 Feet           General Gait Details: mod I for amb in room with UE support on IV pole, no imbalance noted with forward, backward, and lateral steps with mgmt of IV pole. Kyphotic posture, slowed cadence, decreased step length/foot clearance bil.     Balance Overall balance assessment: Modified Independent                                           Pertinent Vitals/Pain Pain Assessment Pain Assessment: 0-10 Pain Score: 7  Pain Location: R side Pain Descriptors / Indicators: Sharp, Aching Pain Intervention(s): Limited activity within patient's tolerance, Premedicated before session, Repositioned, Monitored during session    Home Living Family/patient expects to be discharged to:: Private residence (ab[place]ed house [place], no running water but has access to water) Living Arrangements: Alone Available Help at Discharge: Friend(s);Available PRN/intermittently Type of Home: House Home Access: Level entry       Home Layout: One level Home Equipment: None      Prior Function Prior Level of Function : Independent/Modified Independent             Mobility Comments: IND amb without AD ADLs Comments: IND ADL's and IADL's; not on medication at baseline  Extremity/Trunk Assessment   Upper Extremity Assessment Upper Extremity Assessment: Overall WFL for tasks assessed    Lower Extremity Assessment Lower Extremity Assessment: Overall WFL for tasks assessed       Communication   Communication Communication: No apparent difficulties    Cognition Arousal: Alert Behavior During Therapy: WFL for tasks assessed/performed                             Following commands: Intact       Cueing Cueing Techniques: Verbal cues, Visual cues, Tactile cues     General Comments General comments (skin integrity, edema, etc.): JP drain intact    Exercises Other Exercises Other Exercises: Pt  participates in bed mobility, transfers, ambulation to/from bathroom for toileting, and hand hygiene at mod I level. Educated re: PT role/POC, DC recommendations, DME for energy conservation, pain management, safety with functional mobility, IND amb in room, amb in hallway with nursing/mobility tech.   Assessment/Plan    PT Assessment Patient does not need any further PT services         PT Goals (Current goals can be found in the Care Plan section)  Acute Rehab PT Goals Patient Stated Goal: go home PT Goal Formulation: With patient Time For Goal Achievement: 06/29/24 Potential to Achieve Goals: Good    Frequency  One time visit and DC        AM-PAC PT 6 Clicks Mobility  Outcome Measure Help needed turning from your back to your side while in a flat bed without using bedrails?: None Help needed moving from lying on your back to sitting on the side of a flat bed without using bedrails?: None Help needed moving to and from a bed to a chair (including a wheelchair)?: None Help needed standing up from a chair using your arms (e.g., wheelchair or bedside chair)?: None Help needed to walk in hospital room?: None Help needed climbing 3-5 steps with a railing? : A Little 6 Click Score: 23    End of Session   Activity Tolerance: Patient tolerated treatment well Patient left: in bed Nurse Communication: Mobility status      Time: 0947-1000 PT Time Calculation (min) (ACUTE ONLY): 13 min   Charges:   PT Evaluation $PT Eval Low Complexity: 1 Low   PT General Charges $$ ACUTE PT VISIT: 1 Visit        Camie CHARLENA Kluver, PT, DPT 10:11 AM,06/15/24 Physical Therapist - [place] [facility]

## 2024-06-15 NOTE — Progress Notes (Signed)
 Unable to perform MRI of the Thoracic and Lumbar spine. Pt has a JP drain and states that when technologist placed head held magnet up against it.. It caused severe pain and pulling. Even after further research it appear [employer] 12G is conditional for MRI, We are unable to proceed if the patient states he is having pain at the sight of insertion when hand held Magnet was used. Ordering Physician was notified. Patient refusing exam at this time.

## 2024-06-15 NOTE — Progress Notes (Signed)
 Urine sample sent down to lab.

## 2024-06-16 DIAGNOSIS — A4902 Methicillin resistant Staphylococcus aureus infection, unspecified site: Secondary | ICD-10-CM

## 2024-06-16 LAB — CBC WITH DIFFERENTIAL/PLATELET
Abs Immature Granulocytes: 0.1 K/uL — ABNORMAL HIGH (ref 0.00–0.07)
Basophils Absolute: 0 K/uL (ref 0.0–0.1)
Basophils Relative: 0 %
Eosinophils Absolute: 0.3 K/uL (ref 0.0–0.5)
Eosinophils Relative: 3 %
HCT: 39.8 % (ref 39.0–52.0)
Hemoglobin: 13.2 g/dL (ref 13.0–17.0)
Immature Granulocytes: 1 %
Lymphocytes Relative: 20 %
Lymphs Abs: 1.7 K/uL (ref 0.7–4.0)
MCH: 33.7 pg (ref 26.0–34.0)
MCHC: 33.2 g/dL (ref 30.0–36.0)
MCV: 101.5 fL — ABNORMAL HIGH (ref 80.0–100.0)
Monocytes Absolute: 0.6 K/uL (ref 0.1–1.0)
Monocytes Relative: 8 %
Neutro Abs: 5.8 K/uL (ref 1.7–7.7)
Neutrophils Relative %: 68 %
Platelets: 576 K/uL — ABNORMAL HIGH (ref 150–400)
RBC: 3.92 MIL/uL — ABNORMAL LOW (ref 4.22–5.81)
RDW: 11.9 % (ref 11.5–15.5)
WBC: 8.5 K/uL (ref 4.0–10.5)
nRBC: 0 % (ref 0.0–0.2)

## 2024-06-16 LAB — BASIC METABOLIC PANEL WITH GFR
Anion gap: 6 (ref 5–15)
BUN: 12 mg/dL (ref 6–20)
CO2: 28 mmol/L (ref 22–32)
Calcium: 8.7 mg/dL — ABNORMAL LOW (ref 8.9–10.3)
Chloride: 107 mmol/L (ref 98–111)
Creatinine, Ser: 0.81 mg/dL (ref 0.61–1.24)
GFR, Estimated: >60 mL/min (ref >60)
Glucose, Bld: 112 mg/dL — ABNORMAL HIGH (ref 70–99)
Potassium: 3.9 mmol/L (ref 3.5–5.1)
Sodium: 140 mmol/L (ref 135–145)

## 2024-06-16 LAB — MAGNESIUM: Magnesium: 2.3 mg/dL (ref 1.7–2.4)

## 2024-06-16 LAB — ECHOCARDIOGRAM COMPLETE
Area-P 1/2: 3.24 cm2
Height: 73 in
S' Lateral: 3.4 cm
Weight: 2105.83 [oz_av]

## 2024-06-16 LAB — PHOSPHORUS: Phosphorus: 3.5 mg/dL (ref 2.5–4.6)

## 2024-06-16 LAB — VITAMIN B12: Vitamin B-12: 132 pg/mL — ABNORMAL LOW (ref 180–914)

## 2024-06-16 LAB — FOLATE: Folate: 6.9 ng/mL (ref >5.9)

## 2024-06-16 MED ORDER — FOLIC ACID 1 MG PO TABS
1.0000 mg | ORAL_TABLET | Freq: Every day | ORAL | Status: DC
  Administered 2024-06-16 – 2024-06-22 (×7): 1 mg via ORAL
  Filled 2024-06-16 (×7): qty 1

## 2024-06-16 MED ORDER — DAPTOMYCIN-SODIUM CHLORIDE 500-0.9 MG/50ML-% IV SOLN
8.0000 mg/kg | Freq: Every day | INTRAVENOUS | Status: DC
  Administered 2024-06-16 – 2024-06-21 (×6): 500 mg via INTRAVENOUS
  Filled 2024-06-16 (×7): qty 50

## 2024-06-16 MED ORDER — VITAMIN B-12 1000 MCG PO TABS
500.0000 ug | ORAL_TABLET | Freq: Every day | ORAL | Status: DC
  Administered 2024-06-16 – 2024-06-22 (×7): 500 ug via ORAL
  Filled 2024-06-16 (×7): qty 1

## 2024-06-16 MED ORDER — THIAMINE MONONITRATE 100 MG PO TABS
100.0000 mg | ORAL_TABLET | Freq: Every day | ORAL | Status: DC
  Administered 2024-06-16 – 2024-06-22 (×7): 100 mg via ORAL
  Filled 2024-06-16 (×7): qty 1

## 2024-06-16 MED ORDER — ADULT MULTIVITAMIN W/MINERALS CH
1.0000 | ORAL_TABLET | Freq: Every day | ORAL | Status: DC
  Administered 2024-06-17 – 2024-06-22 (×6): 1 via ORAL
  Filled 2024-06-16 (×6): qty 1

## 2024-06-16 NOTE — Progress Notes (Signed)
 Progress Note   Patient: Craig Doyle FMW:[id number] DOB: [birth year] DOA: 06/12/2024     3 DOS: the patient was seen and examined on 06/16/2024   Brief hospital course: 58 y.o. male with medical history significant for Tobacco use disorder being admitted with a right psoas abscess.  He presented with a 9-day history of right lower abdominal and low back pain and intermittent fevers.  He denies dysuria, change in bowel habits, nausea or vomiting In the ED he was tachycardic to 116 with otherwise unremarkable vitals.  Labs notable for WBC 18,000, lactic acid pending EKG. showed NSR at 88 CT abdomen and pelvis showing right psoas muscle abscess the ED provider spoke with IR who will provide drainage in the a.m. Patient started on Zosyn  and given a fluid bolus and pain control Admission requested   8/18.  Case discussed with interventional radiology and they will bring down for drainage of abscess.  Continue IV antibiotics. 8/19.  Staphylococcus aureus growing out of culture.  Sensitivities pending.  Continue IV vancomycin  and d/c Zosyn . 8/20 ID rec MRI TLS to r/o discitis, patient had discomfort at drain site when screened with bedside magnet   Assessment and Plan: * Staphylococcus aureus sepsis (HCC)  Sepsis physiology is resolved. On admission had leukocytosis, tachycardia and psoas muscle abscess.  Status post drainage with IR on 8/18 w/ JP drain.  Staph aureus growing out of culture and sensitivities pending.  Continue IV vancomycin .  ID  consulted.  -F/u TTE  -Will reach out to IR to see if timing of drain removal (Only 50cc of output recorded) and consider MRI of T and L-spine at that time    -Continue vancomycin   Psoas abscess, right (HCC) Continue IV vancomycin .  Drain per IR.   Hypotension resolved Improved with IV fluids.  In the setting of infection.   Thrombocytosis Likely elevated due to sepsis.    Latest Ref Rng & Units 06/16/2024    3:34 AM 06/14/2024    3:22 AM  06/13/2024    6:04 AM  CBC  WBC 4.0 - 10.5 K/uL 8.5  14.1  16.7   Hemoglobin 13.0 - 17.0 g/dL 86.7  88.1  87.5   Hematocrit 39.0 - 52.0 % 39.8  35.0  35.1   Platelets 150 - 400 K/uL 576  479  488   Continue to monitor   Hyponatremia Resolved     Latest Ref Rng & Units 06/16/2024    3:34 AM 06/14/2024    3:22 AM 06/13/2024    6:04 AM  BMP  Glucose 70 - 99 mg/dL 887  886  99   BUN 6 - 20 mg/dL 12  12  11    Creatinine 0.61 - 1.24 mg/dL 9.18  9.14  9.03   Sodium 135 - 145 mmol/L 140  134  138   Potassium 3.5 - 5.1 mmol/L 3.9  4.2  3.8   Chloride 98 - 111 mmol/L 107  102  102   CO2 22 - 32 mmol/L 28  24  25    Calcium 8.9 - 10.3 mg/dL 8.7  8.4  8.6      Anemia of chronic disease Hgb 13.2 this morning.  B12 and folate decreased.  Will supplement.  -F/u iron and TIBC    Malnutrition BMI 17.36.  Consult dietitian.   Tobacco use disorder Continue nicotine  patch     Subjective: Continues to have some discomfort at site of JP drain insertion site.  Physical Exam: Vitals:   06/15/24 1644  06/15/24 1928 06/16/24 0548 06/16/24 0855  BP: 109/67 104/62 117/61 111/63  Pulse: 63 (!) 59 (!) 54 (!) 56  Resp: 16 18 15 16   Temp: 97.6 F (36.4 C) 97.7 F (36.5 C) 98.1 F (36.7 C) 97.9 F (36.6 C)  TempSrc: Oral Oral    SpO2: 99% 99% 100% 99%  Weight:      Height:       Physical Exam  Constitutional: In no distress. Thin Cardiovascular: Normal rate, regular rhythm. No lower extremity edema  Pulmonary: Non labored breathing on room air, no wheezing or rales.   Abdominal: Soft. Non distended. JP drain at right side with scant SS material in bulb.  Musculoskeletal: Normal range of motion.     Neurological: Alert and oriented to person, place, and time. Non focal  Skin: Skin is warm and dry.     Data Reviewed: Staph aureus growing out of culture.   Susceptibility  Methicillin resistant staphylococcus aureus (ZZ00)  Antibiotic Interpretation Microscan Method Status    CIPROFLOXACIN Resistant >=8 RESISTANT MIC Final   ERYTHROMYCIN Resistant >=8 RESISTANT MIC Final   GENTAMICIN Sensitive <=0.5 SENSITIVE MIC Final   OXACILLIN Resistant >=4 RESISTANT MIC Final   TETRACYCLINE Sensitive <=1 SENSITIVE MIC Final   VANCOMYCIN  Sensitive 1 SENSITIVE MIC Final   TRIMETH/SULFA Resistant >=320 RESISTANT MIC Final   CLINDAMYCIN Sensitive <=0.25 SENSITIVE MIC Final   RIFAMPIN Sensitive <=0.5 SENSITIVE MIC Final   Inducible Clindamycin Sensitive NEGATIVE MIC Final   LINEZOLID Sensitive 2 SENSITIVE MIC Final   Susceptibility Comments  ABUNDANT METHICILL.  IN RESISTANT STAPHYLOCOCCUS AUREUS      Family Communication: Declined  Disposition: Status is: Inpatient Remains inpatient appropriate because: Continue IV antibiotics.  Continue JP drain   Planned Discharge Destination: Home    Time spent: 35 minutes  Author: Alban Pepper, MD 06/16/2024 3:16 PM  For on call review [email or web address].

## 2024-06-16 NOTE — Progress Notes (Signed)
 Date of Admission:  06/12/2024     ID: Craig Doyle is a 58 y.o. male Principal Problem:   Staphylococcus aureus sepsis (HCC) Active Problems:   Psoas abscess, right (HCC)   Hyponatremia   Thrombocytosis   Hypotension   Anemia of chronic disease   Staph aureus infection    Subjective: Feeling better  Medications:   vitamin B-12  500 mcg Oral Daily   feeding supplement  237 mL Oral BID BM   folic acid   1 mg Oral Daily   [START ON 06/17/2024] multivitamin with minerals  1 tablet Oral Daily   nicotine   14 mg Transdermal Daily   polyethylene glycol  17 g Oral Daily   senna  1 tablet Oral Daily   sodium chloride  flush  10 mL Intracatheter Q8H   thiamine   100 mg Oral Daily    Objective: Vital signs in last 24 hours: Patient Vitals for the past 24 hrs:  BP Temp Temp src Pulse Resp SpO2  06/16/24 1742 115/71 97.6 F (36.4 C) -- (!) 57 17 99 %  06/16/24 0855 111/63 97.9 F (36.6 C) -- (!) 56 16 99 %  06/16/24 0548 117/61 98.1 F (36.7 C) -- (!) 54 15 100 %  06/15/24 1928 104/62 97.7 F (36.5 C) Oral (!) 59 18 99 %       PHYSICAL EXAM:  General: Alert, cooperative, no distress, appears stated age.  Lungs: Clear to auscultation bilaterally. No Wheezing or Rhonchi. No rales. Heart: Regular rate and rhythm, no murmur, rub or gallop. Abdomen: Soft, non-tender,not distended. Bowel sounds normal. No masses Rt flank area drain present- reddish pus in bulb- less in qunatity Extremities: atraumatic, no cyanosis. No edema. No clubbing Skin: No rashes or lesions. Or bruising Lymph: Cervical, supraclavicular normal. Neurologic: Grossly non-focal  Lab Results    Latest Ref Rng & Units 06/16/2024    3:34 AM 06/14/2024    3:22 AM 06/13/2024    6:04 AM  CBC  WBC 4.0 - 10.5 K/uL 8.5  14.1  16.7   Hemoglobin 13.0 - 17.0 g/dL 86.7  88.1  87.5   Hematocrit 39.0 - 52.0 % 39.8  35.0  35.1   Platelets 150 - 400 K/uL 576  479  488        Latest Ref Rng & Units 06/16/2024    3:34 AM  06/14/2024    3:22 AM 06/13/2024    6:04 AM  CMP  Glucose 70 - 99 mg/dL 887  886  99   BUN 6 - 20 mg/dL 12  12  11    Creatinine 0.61 - 1.24 mg/dL 9.18  9.14  9.03   Sodium 135 - 145 mmol/L 140  134  138   Potassium 3.5 - 5.1 mmol/L 3.9  4.2  3.8   Chloride 98 - 111 mmol/L 107  102  102   CO2 22 - 32 mmol/L 28  24  25    Calcium 8.9 - 10.3 mg/dL 8.7  8.4  8.6   Total Protein 6.5 - 8.1 g/dL  5.4    Total Bilirubin 0.0 - 1.2 mg/dL  0.6    Alkaline Phos 38 - 126 U/L  66    AST 15 - 41 U/L  29    ALT 0 - 44 U/L  24        Microbiology: Abscess culture MRSA  Studies/Results: ECHOCARDIOGRAM COMPLETE Result Date: 06/16/2024    ECHOCARDIOGRAM REPORT   Patient Name:   VICTOR LANGENBACH  Date of Exam: 06/15/2024 Medical Rec #:  [id number]     Height:       73.0 in Accession #:    [id number]    Weight:       131.6 lb Date of Birth:  [birth year]     BSA:          1.801 m Patient Age:    58 years      BP:           122/70 mmHg Patient Gender: M             HR:           60 bpm. Exam Location:  [facility] Procedure: 2D Echo, Cardiac Doppler and Color Doppler (Both Spectral and Color            Flow Doppler were utilized during procedure). Indications:     I38 Endocarditis  History:         Patient has no prior history of Echocardiogram examinations.  Sonographer:     Carl Coma RDCS Referring Phys:  [id number] DONALD BERLIN Diagnosing Phys: Evalene Lunger MD IMPRESSIONS  1. Left ventricular ejection fraction, by estimation, is 60 to 65%. The left ventricle has normal function. The left ventricle has no regional wall motion abnormalities. Left ventricular diastolic parameters were normal.  2. Right ventricular systolic function is normal. The right ventricular size is normal.  3. The mitral valve is normal in structure. Mild mitral valve regurgitation. No evidence of mitral stenosis.  4. Tricuspid valve regurgitation is mild to moderate.  5. The aortic valve is normal in structure. Aortic valve  regurgitation is not visualized. No aortic stenosis is present.  6. The inferior vena cava is normal in size with greater than 50% respiratory variability, suggesting right atrial pressure of 3 mmHg.  7. no valve vegetation noted FINDINGS  Left Ventricle: Left ventricular ejection fraction, by estimation, is 60 to 65%. The left ventricle has normal function. The left ventricle has no regional wall motion abnormalities. Strain was performed and the global longitudinal strain is indeterminate. The left ventricular internal cavity size was normal in size. There is no left ventricular hypertrophy. Left ventricular diastolic parameters were normal. Right Ventricle: The right ventricular size is normal. No increase in right ventricular wall thickness. Right ventricular systolic function is normal. Left Atrium: Left atrial size was normal in size. Right Atrium: Right atrial size was normal in size. Pericardium: There is no evidence of pericardial effusion. Mitral Valve: The mitral valve is normal in structure. There is mild calcification of the mitral valve leaflet(s). Mild mitral valve regurgitation. No evidence of mitral valve stenosis. There is no evidence of mitral valve vegetation. Tricuspid Valve: The tricuspid valve is normal in structure. Tricuspid valve regurgitation is mild to moderate. No evidence of tricuspid stenosis. There is no evidence of tricuspid valve vegetation. Aortic Valve: The aortic valve is normal in structure. Aortic valve regurgitation is not visualized. No aortic stenosis is present. There is no evidence of aortic valve vegetation. Pulmonic Valve: The pulmonic valve was normal in structure. Pulmonic valve regurgitation is not visualized. No evidence of pulmonic stenosis. Aorta: The aortic root is normal in size and structure. Venous: The inferior vena cava is normal in size with greater than 50% respiratory variability, suggesting right atrial pressure of 3 mmHg. IAS/Shunts: No atrial level  shunt detected by color flow Doppler. Additional Comments: 3D was performed not requiring image post processing on an independent workstation and was indeterminate.  LEFT  VENTRICLE PLAX 2D LVIDd:         5.10 cm   Diastology LVIDs:         3.40 cm   LV e' medial:    8.92 cm/s LV PW:         0.60 cm   LV E/e' medial:  7.1 LV IVS:        0.60 cm   LV e' lateral:   12.75 cm/s LVOT diam:     2.20 cm   LV E/e' lateral: 4.9 LV SV:         80 LV SV Index:   44 LVOT Area:     3.80 cm  RIGHT VENTRICLE             IVC RV Basal diam:  3.90 cm     IVC diam: 1.70 cm RV S prime:     16.25 cm/s TAPSE (M-mode): 2.4 cm LEFT ATRIUM             Index        RIGHT ATRIUM           Index LA diam:        4.20 cm 2.33 cm/m   RA Area:     16.40 cm LA Vol (A2C):   57.9 ml 32.15 ml/m  RA Volume:   49.50 ml  27.48 ml/m LA Vol (A4C):   51.3 ml 28.48 ml/m LA Biplane Vol: 57.0 ml 31.65 ml/m  AORTIC VALVE LVOT Vmax:   99.25 cm/s LVOT Vmean:  61.900 cm/s LVOT VTI:    0.210 m  AORTA Ao Root diam: 3.50 cm MITRAL VALVE MV Area (PHT): 3.24 cm    SHUNTS MV Decel Time: 234 msec    Systemic VTI:  0.21 m MV E velocity: 63.00 cm/s  Systemic Diam: 2.20 cm MV A velocity: 58.50 cm/s MV E/A ratio:  1.08 Evalene Lunger MD Electronically signed by Evalene Lunger MD Signature Date/Time: 06/16/2024/4:46:53 PM    Final      Assessment/Plan: Right psoas abscess secondary to MRSA Patient has a drain placed by IR The source of the psoas abscess is unclear Need to get MRI of the thoracolumbar spine to look for any discitis/vertebral osteomyelitis- but because of small metal in JP drain he felt a magnetic pull and the waiting on IR to change the drain before MRI can be done Blood culture neg so far Will order a 2D echo to look for valves vegetation Patient is currently on vancomycin  - change to daptomycin     History of substance use Urine tox screen neg   Leukocytosis and thrombocytosis secondary to the above infection- improving   Hypoalbuminemia  HIV non reactive  Discussed the management with patient and pharmacist

## 2024-06-16 NOTE — Plan of Care (Signed)

## 2024-06-16 NOTE — Progress Notes (Signed)
 Initial Nutrition Assessment  DOCUMENTATION CODES:   Underweight, Severe malnutrition in context of chronic illness  INTERVENTION:   -Continue regular diet -Ensure Plus High Protein po BID, each supplement provides 350 kcal and 20 grams of protein  -MVI with minerals daily -Continue 1 mg folic acid  daily -Continue 100 mg thiamine  daily   NUTRITION DIAGNOSIS:   Severe Malnutrition related to social / environmental circumstances as evidenced by severe fat depletion, severe muscle depletion.  GOAL:   Patient will meet greater than or equal to 90% of their needs  MONITOR:   PO intake, Supplement acceptance  REASON FOR ASSESSMENT:   Consult Assessment of nutrition requirement/status  ASSESSMENT:   Pt with medical history significant for Tobacco use disorder being admitted with a right psoas abscess.  He presented with a 9-day history of right lower abdominal and low back pain and intermittent fevers.  Pt admitted with staphylococcus aureus and posas abscess.   8/18- s/p CT guided placement of 34F drain into right retroperitoneal abscess   Reviewed I/O's: +670 ml x 24 hours and +5.1 L since admission  Drain output: 50 ml x 24 hours  Spoke with pt at bedside, who was pleasant and in good spirits today. Pt shares that he is feeling better today; he complains of night sweats and pain near drain area, which has improved.   Pt very forthcoming with RD about history. Pt was released from prison in January 2021. Pt initially lived with his sister upon prison release, however, left after getting into an argument with her. For the past few months, pt has been [place]. Pt reports that it was difficult for him to obtain food due to limited financial resources, however, this improved when he was able to obtain food stamps last month. Per pt, he was eating well until about 2 weeks PTA secondary to sickness and night sweats. He initially thought he had a  fever, but presented to the hospital when it worsened.   Pt was purchasing food from the grocery store and storing it in plastic coolers or in the rafters of his home. Pt reports this typically worked well, however, raccoons invaded his food supply by chewing through one of his coolers and peanut butter jar.   Pt endorses weight loss; he suspects the majority of his weight loss was over the past month related to sickness.   Pt has been eating most of the food here in the hospital Noted meal completions 100%. Pt does not have any teeth, but declines offer of modified diet. Pt prefers to pick and cheese foods that he is able to tolerate. Discussed importance of good meal and supplement intake to promote healing. Pt requesting Ensure supplements, which RD will order. RD also assisted pt with ordering breakfast and providing coffee per his request.    Medications reviewed and include vitamin B-12, folic acid , miralax , senokot, and thiamine .  Labs reviewed.    NUTRITION - FOCUSED PHYSICAL EXAM:  Flowsheet Row Most Recent Value  Orbital Region Severe depletion  Upper Arm Region Severe depletion  Thoracic and Lumbar Region Severe depletion  Buccal Region Severe depletion  Temple Region Severe depletion  Clavicle Bone Region Severe depletion  Clavicle and Acromion Bone Region Severe depletion  Scapular Bone Region Severe depletion  Dorsal Hand Severe depletion  Patellar Region Severe depletion  Anterior Thigh Region Severe depletion  Posterior Calf Region Severe depletion  Edema (RD Assessment) None  Hair Reviewed  Eyes Reviewed  Mouth Reviewed  Skin Reviewed  Nails Reviewed    Diet Order:   Diet Order             Diet regular Room service appropriate? Yes; Fluid consistency: Thin  Diet effective now                   EDUCATION NEEDS:   Education needs have been addressed  Skin:  Skin Assessment: Reviewed RN Assessment  Last BM:  06/13/24  Height:   Ht Readings from  Last 1 Encounters:  06/12/24 6' 1 (1.854 m)    Weight:   Wt Readings from Last 1 Encounters:  06/12/24 59.7 kg    Ideal Body Weight:  83.6 kg  BMI:  Body mass index is 17.36 kg/m.  Estimated Nutritional Needs:   Kcal:  1900-2100  Protein:  105-120 grams  Fluid:  1.9-2.1 L    Margery ORN, RD, LDN, CDCES Registered Dietitian III Certified Diabetes Care and Education Specialist If unable to reach this RD, please use RD Inpatient group chat on secure chat between hours of 8am-4 pm daily

## 2024-06-17 DIAGNOSIS — E43 Unspecified severe protein-calorie malnutrition: Secondary | ICD-10-CM | POA: Insufficient documentation

## 2024-06-17 LAB — IRON AND TIBC
Iron: 59 ug/dL (ref 45–182)
Saturation Ratios: 32 % (ref 17.9–39.5)
TIBC: 186 ug/dL — ABNORMAL LOW (ref 250–450)
UIBC: 127 ug/dL

## 2024-06-17 LAB — CBC WITH DIFFERENTIAL/PLATELET
Abs Immature Granulocytes: 0.1 K/uL — ABNORMAL HIGH (ref 0.00–0.07)
Basophils Absolute: 0.1 K/uL (ref 0.0–0.1)
Basophils Relative: 1 %
Eosinophils Absolute: 0.4 K/uL (ref 0.0–0.5)
Eosinophils Relative: 4 %
HCT: 36.5 % — ABNORMAL LOW (ref 39.0–52.0)
Hemoglobin: 12.1 g/dL — ABNORMAL LOW (ref 13.0–17.0)
Immature Granulocytes: 1 %
Lymphocytes Relative: 21 %
Lymphs Abs: 1.8 K/uL (ref 0.7–4.0)
MCH: 33.2 pg (ref 26.0–34.0)
MCHC: 33.2 g/dL (ref 30.0–36.0)
MCV: 100 fL (ref 80.0–100.0)
Monocytes Absolute: 0.7 K/uL (ref 0.1–1.0)
Monocytes Relative: 8 %
Neutro Abs: 5.5 K/uL (ref 1.7–7.7)
Neutrophils Relative %: 65 %
Platelets: 560 K/uL — ABNORMAL HIGH (ref 150–400)
RBC: 3.65 MIL/uL — ABNORMAL LOW (ref 4.22–5.81)
RDW: 11.8 % (ref 11.5–15.5)
WBC: 8.5 K/uL (ref 4.0–10.5)
nRBC: 0 % (ref 0.0–0.2)

## 2024-06-17 LAB — BASIC METABOLIC PANEL WITH GFR
Anion gap: 7 (ref 5–15)
BUN: 12 mg/dL (ref 6–20)
CO2: 29 mmol/L (ref 22–32)
Calcium: 8.7 mg/dL — ABNORMAL LOW (ref 8.9–10.3)
Chloride: 102 mmol/L (ref 98–111)
Creatinine, Ser: 0.85 mg/dL (ref 0.61–1.24)
GFR, Estimated: >60 mL/min (ref >60)
Glucose, Bld: 109 mg/dL — ABNORMAL HIGH (ref 70–99)
Potassium: 4 mmol/L (ref 3.5–5.1)
Sodium: 138 mmol/L (ref 135–145)

## 2024-06-17 LAB — CK: Total CK: 12 U/L — ABNORMAL LOW (ref 49–397)

## 2024-06-17 MED ORDER — MAGNESIUM HYDROXIDE 400 MG/5ML PO SUSP
30.0000 mL | Freq: Every day | ORAL | Status: DC | PRN
  Administered 2024-06-17: 30 mL via ORAL
  Filled 2024-06-17: qty 30

## 2024-06-17 NOTE — Progress Notes (Signed)
 Date of Admission:  06/12/2024     ID: Craig Doyle is a 58 y.o. male Principal Problem:   Staphylococcus aureus sepsis (HCC) Active Problems:   Psoas abscess, right (HCC)   Hyponatremia   Thrombocytosis   Hypotension   Anemia of chronic disease   Staph aureus infection   MRSA infection   Protein-calorie malnutrition, severe    Subjective: status quo No complaint No change  Medications:   vitamin B-12  500 mcg Oral Daily   feeding supplement  237 mL Oral BID BM   folic acid   1 mg Oral Daily   multivitamin with minerals  1 tablet Oral Daily   nicotine   14 mg Transdermal Daily   polyethylene glycol  17 g Oral Daily   senna  1 tablet Oral Daily   sodium chloride  flush  10 mL Intracatheter Q8H   thiamine   100 mg Oral Daily    Objective: Vital signs in last 24 hours: Patient Vitals for the past 24 hrs:  BP Temp Pulse Resp SpO2  06/17/24 1008 (!) 102/52 98.1 F (36.7 C) (!) 51 18 98 %  06/17/24 0815 (!) 105/58 (!) 97.2 F (36.2 C) (!) 48 16 98 %  06/17/24 0414 (!) 97/54 97.8 F (36.6 C) (!) 56 16 97 %  06/16/24 2125 113/69 (!) 97.4 F (36.3 C) (!) 59 18 99 %  06/16/24 1742 115/71 97.6 F (36.4 C) (!) 57 17 99 %       PHYSICAL EXAM:  General: Alert, cooperative, no distress, appears stated age.  Lungs: Clear to auscultation bilaterally. No Wheezing or Rhonchi. No rales. Heart: Regular rate and rhythm, no murmur, rub or gallop. Abdomen: Soft, non-tender,not distended. Bowel sounds normal. No masses Rt flank area drain present- thin red fluid- less in qunatity Extremities: atraumatic, no cyanosis. No edema. No clubbing Skin: No rashes or lesions. Or bruising Lymph: Cervical, supraclavicular normal. Neurologic: Grossly non-focal  Lab Results    Latest Ref Rng & Units 06/17/2024    5:18 AM 06/16/2024    3:34 AM 06/14/2024    3:22 AM  CBC  WBC 4.0 - 10.5 K/uL 8.5  8.5  14.1   Hemoglobin 13.0 - 17.0 g/dL 87.8  86.7  88.1   Hematocrit 39.0 - 52.0 % 36.5  39.8   35.0   Platelets 150 - 400 K/uL 560  576  479        Latest Ref Rng & Units 06/17/2024    5:18 AM 06/16/2024    3:34 AM 06/14/2024    3:22 AM  CMP  Glucose 70 - 99 mg/dL 890  887  886   BUN 6 - 20 mg/dL 12  12  12    Creatinine 0.61 - 1.24 mg/dL 9.14  9.18  9.14   Sodium 135 - 145 mmol/L 138  140  134   Potassium 3.5 - 5.1 mmol/L 4.0  3.9  4.2   Chloride 98 - 111 mmol/L 102  107  102   CO2 22 - 32 mmol/L 29  28  24    Calcium 8.9 - 10.3 mg/dL 8.7  8.7  8.4   Total Protein 6.5 - 8.1 g/dL   5.4   Total Bilirubin 0.0 - 1.2 mg/dL   0.6   Alkaline Phos 38 - 126 U/L   66   AST 15 - 41 U/L   29   ALT 0 - 44 U/L   24       Microbiology: Abscess culture MRSA  Studies/Results: ECHOCARDIOGRAM COMPLETE Result Date: 06/16/2024    ECHOCARDIOGRAM REPORT   Patient Name:   Craig Doyle Date of Exam: 06/15/2024 Medical Rec #:  [id number]     Height:       73.0 in Accession #:    [id number]    Weight:       131.6 lb Date of Birth:  [birth year]     BSA:          1.801 m Patient Age:    58 years      BP:           122/70 mmHg Patient Gender: M             HR:           60 bpm. Exam Location:  [facility] Procedure: 2D Echo, Cardiac Doppler and Color Doppler (Both Spectral and Color            Flow Doppler were utilized during procedure). Indications:     I38 Endocarditis  History:         Patient has no prior history of Echocardiogram examinations.  Sonographer:     Carl Coma RDCS Referring Phys:  [id number] DONALD BERLIN Diagnosing Phys: Evalene Lunger MD IMPRESSIONS  1. Left ventricular ejection fraction, by estimation, is 60 to 65%. The left ventricle has normal function. The left ventricle has no regional wall motion abnormalities. Left ventricular diastolic parameters were normal.  2. Right ventricular systolic function is normal. The right ventricular size is normal.  3. The mitral valve is normal in structure. Mild mitral valve regurgitation. No evidence of mitral stenosis.  4. Tricuspid valve  regurgitation is mild to moderate.  5. The aortic valve is normal in structure. Aortic valve regurgitation is not visualized. No aortic stenosis is present.  6. The inferior vena cava is normal in size with greater than 50% respiratory variability, suggesting right atrial pressure of 3 mmHg.  7. no valve vegetation noted FINDINGS  Left Ventricle: Left ventricular ejection fraction, by estimation, is 60 to 65%. The left ventricle has normal function. The left ventricle has no regional wall motion abnormalities. Strain was performed and the global longitudinal strain is indeterminate. The left ventricular internal cavity size was normal in size. There is no left ventricular hypertrophy. Left ventricular diastolic parameters were normal. Right Ventricle: The right ventricular size is normal. No increase in right ventricular wall thickness. Right ventricular systolic function is normal. Left Atrium: Left atrial size was normal in size. Right Atrium: Right atrial size was normal in size. Pericardium: There is no evidence of pericardial effusion. Mitral Valve: The mitral valve is normal in structure. There is mild calcification of the mitral valve leaflet(s). Mild mitral valve regurgitation. No evidence of mitral valve stenosis. There is no evidence of mitral valve vegetation. Tricuspid Valve: The tricuspid valve is normal in structure. Tricuspid valve regurgitation is mild to moderate. No evidence of tricuspid stenosis. There is no evidence of tricuspid valve vegetation. Aortic Valve: The aortic valve is normal in structure. Aortic valve regurgitation is not visualized. No aortic stenosis is present. There is no evidence of aortic valve vegetation. Pulmonic Valve: The pulmonic valve was normal in structure. Pulmonic valve regurgitation is not visualized. No evidence of pulmonic stenosis. Aorta: The aortic root is normal in size and structure. Venous: The inferior vena cava is normal in size with greater than 50%  respiratory variability, suggesting right atrial pressure of 3 mmHg. IAS/Shunts: No atrial level shunt detected by color flow  Doppler. Additional Comments: 3D was performed not requiring image post processing on an independent workstation and was indeterminate.  LEFT VENTRICLE PLAX 2D LVIDd:         5.10 cm   Diastology LVIDs:         3.40 cm   LV e' medial:    8.92 cm/s LV PW:         0.60 cm   LV E/e' medial:  7.1 LV IVS:        0.60 cm   LV e' lateral:   12.75 cm/s LVOT diam:     2.20 cm   LV E/e' lateral: 4.9 LV SV:         80 LV SV Index:   44 LVOT Area:     3.80 cm  RIGHT VENTRICLE             IVC RV Basal diam:  3.90 cm     IVC diam: 1.70 cm RV S prime:     16.25 cm/s TAPSE (M-mode): 2.4 cm LEFT ATRIUM             Index        RIGHT ATRIUM           Index LA diam:        4.20 cm 2.33 cm/m   RA Area:     16.40 cm LA Vol (A2C):   57.9 ml 32.15 ml/m  RA Volume:   49.50 ml  27.48 ml/m LA Vol (A4C):   51.3 ml 28.48 ml/m LA Biplane Vol: 57.0 ml 31.65 ml/m  AORTIC VALVE LVOT Vmax:   99.25 cm/s LVOT Vmean:  61.900 cm/s LVOT VTI:    0.210 m  AORTA Ao Root diam: 3.50 cm MITRAL VALVE MV Area (PHT): 3.24 cm    SHUNTS MV Decel Time: 234 msec    Systemic VTI:  0.21 m MV E velocity: 63.00 cm/s  Systemic Diam: 2.20 cm MV A velocity: 58.50 cm/s MV E/A ratio:  1.08 Evalene Lunger MD Electronically signed by Evalene Lunger MD Signature Date/Time: 06/16/2024/4:46:53 PM    Final      Assessment/Plan: Right psoas abscess secondary to MRSA Patient has a drain placed by IR The source of the psoas abscess is unclear Need to get MRI of the thoracolumbar spine to look for any discitis/vertebral osteomyelitis- but because of small metal in JP drain he felt a magnetic pull , but IR has cleared him to have MRI with the drain in place Blood culture neg so far 2D echo no  valve vegetation Patient is currently on daptomycin     History of substance use Urine tox screen neg   Leukocytosis and thrombocytosis secondary to  the above infection- improving  Hypoalbuminemia  HIV non reactive  Discussed the management with patient and hospitalist ID will not see him this weekend On call ID available by phone for urgent issues- call if needed

## 2024-06-17 NOTE — Plan of Care (Signed)
   Problem: Education: Goal: Knowledge of General Education information will improve Description: Including pain rating scale, medication(s)/side effects and non-pharmacologic comfort measures Outcome: Progressing   Problem: Clinical Measurements: Goal: Ability to maintain clinical measurements within normal limits will improve Outcome: Progressing Goal: Diagnostic test results will improve Outcome: Progressing

## 2024-06-17 NOTE — Progress Notes (Signed)
 Progress Note   Patient: Craig Doyle FMW:[id number] DOB: 27-Jul-1966 DOA: 06/12/2024     4 DOS: the patient was seen and examined on 06/17/2024   Brief hospital course: 58 y.o. male with medical history significant for Tobacco use disorder being admitted with a right psoas abscess.  He presented with a 9-day history of right lower abdominal and low back pain and intermittent fevers.  He denies dysuria, change in bowel habits, nausea or vomiting In the ED he was tachycardic to 116 with otherwise unremarkable vitals.  Labs notable for WBC 18,000, lactic acid pending EKG. showed NSR at 88 CT abdomen and pelvis showing right psoas muscle abscess the ED provider spoke with IR who will provide drainage in the a.m. Patient started on Zosyn  and given a fluid bolus and pain control Admission requested   8/18.  Case discussed with interventional radiology and they will bring down for drainage of abscess.  Continue IV antibiotics. 8/19.  Staphylococcus aureus growing out of culture.  Sensitivities pending.  Continue IV vancomycin  and d/c Zosyn . 8/20 ID rec MRI TLS to r/o discitis, patient had discomfort at drain site when screened with bedside magnet   Assessment and Plan: * Staphylococcus aureus sepsis (HCC)  Sepsis physiology is resolved. On admission had leukocytosis, tachycardia and psoas muscle abscess.  Status post drainage with IR on 8/18 w/ JP drain.  Staph aureus growing out of culture and sensitivities pending.  Continue IV vancomycin .  ID  consulted.  TTE with no valve vegetations. - Per IR they would like the drain to remain at this time.  An additional clamp was added to secure the drain.  Discussed with patient in the a.m. if he is willing to undergo MRI of T and L-spine. -Continue daptomycin  per infectious disease  Psoas abscess, right (HCC) Continue daptomycin .  Drain per IR.   Hypotension resolved Improved with IV fluids.  In the setting of infection.     06/17/2024    5:12 PM  06/17/2024   10:08 AM 06/17/2024    8:15 AM  Vitals with BMI  Systolic 107 102 894  Diastolic 60 52 58  Pulse 59 51 48     Thrombocytosis Likely elevated due to infection.     Latest Ref Rng & Units 06/17/2024    5:18 AM 06/16/2024    3:34 AM 06/14/2024    3:22 AM  CBC  WBC 4.0 - 10.5 K/uL 8.5  8.5  14.1   Hemoglobin 13.0 - 17.0 g/dL 87.8  86.7  88.1   Hematocrit 39.0 - 52.0 % 36.5  39.8  35.0   Platelets 150 - 400 K/uL 560  576  479   Continue to monitor L  Hyponatremia Resolved     Latest Ref Rng & Units 06/17/2024    5:18 AM 06/16/2024    3:34 AM 06/14/2024    3:22 AM  BMP  Glucose 70 - 99 mg/dL 890  887  886   BUN 6 - 20 mg/dL 12  12  12    Creatinine 0.61 - 1.24 mg/dL 9.14  9.18  9.14   Sodium 135 - 145 mmol/L 138  140  134   Potassium 3.5 - 5.1 mmol/L 4.0  3.9  4.2   Chloride 98 - 111 mmol/L 102  107  102   CO2 22 - 32 mmol/L 29  28  24    Calcium 8.9 - 10.3 mg/dL 8.7  8.7  8.4      Anemia of chronic disease Macrocytosis  Hgb 13.2 this morning.  B12 and folate decreased.  Will supplement.  -F/u iron and TIBC    Malnutrition BMI 17.36.  Consult dietitian.   Tobacco use disorder Continue nicotine  patch     Subjective: Continues to have pain at drain site.   Physical Exam: Vitals:   06/17/24 0414 06/17/24 0815 06/17/24 1008 06/17/24 1712  BP: (!) 97/54 (!) 105/58 (!) 102/52 107/60  Pulse: (!) 56 (!) 48 (!) 51 (!) 59  Resp: 16 16 18 16   Temp: 97.8 F (36.6 C) (!) 97.2 F (36.2 C) 98.1 F (36.7 C) 98.2 F (36.8 C)  TempSrc:      SpO2: 97% 98% 98% 100%  Weight:      Height:        Constitutional: In no distress.  Cardiovascular: Normal rate, regular rhythm. No lower extremity edema  Pulmonary: Non labored breathing on room air, no wheezing or rales.   Abdominal: Soft.  Non distended. JP drain at R side w/ SS material  Musculoskeletal: Normal range of motion.     Neurological: Alert and oriented to person, place, and time. Non focal  Skin:  Skin is warm and dry.      Data Reviewed: Staph aureus growing out of culture.   Susceptibility  Methicillin resistant staphylococcus aureus (ZZ00)  Antibiotic Interpretation Microscan Method Status   CIPROFLOXACIN Resistant >=8 RESISTANT MIC Final   ERYTHROMYCIN Resistant >=8 RESISTANT MIC Final   GENTAMICIN Sensitive <=0.5 SENSITIVE MIC Final   OXACILLIN Resistant >=4 RESISTANT MIC Final   TETRACYCLINE Sensitive <=1 SENSITIVE MIC Final   VANCOMYCIN  Sensitive 1 SENSITIVE MIC Final   TRIMETH/SULFA Resistant >=320 RESISTANT MIC Final   CLINDAMYCIN Sensitive <=0.25 SENSITIVE MIC Final   RIFAMPIN Sensitive <=0.5 SENSITIVE MIC Final   Inducible Clindamycin Sensitive NEGATIVE MIC Final   LINEZOLID Sensitive 2 SENSITIVE MIC Final   Susceptibility Comments  ABUNDANT METHICILL.  IN RESISTANT STAPHYLOCOCCUS AUREUS      Family Communication: Declined  Disposition: Status is: Inpatient Remains inpatient appropriate because: Continue IV antibiotics.  Continue JP drain   Planned Discharge Destination: Home    Time spent: 35 minutes  Author: Alban Pepper, MD 06/17/2024 6:46 PM  For on call review [email or web address].

## 2024-06-17 NOTE — Progress Notes (Addendum)
 Referring Provider(s): Dr. Delayne Solian, MD  Supervising Physician: Jenna Hacker  Patient Status:  Craig Doyle  Chief Complaint: Right psoas fluid collection, s/p drain placement on 8/18.  Subjective:  Patient alert and laying in bed, calm.  He endorses moderate tenderness from drain, and severe tenderness with manipulation of his drain. Patient denies any fevers, headache, chest pain, SOB, cough, nausea, vomiting or bleeding.    Allergies: Patient has no known allergies.  Medications: Prior to Admission medications   Medication Sig Start Date End Date Taking? Authorizing Provider  ibuprofen (ADVIL) 200 MG tablet Take 600 mg by mouth every 6 (six) hours as needed for moderate pain (pain score 4-6) or mild pain (pain score 1-3).   Yes [provider]  Pseudoeph-Doxylamine-DM-APAP (NYQUIL PO) Take 15 mLs by mouth every 6 (six) hours.   Yes [provider]     Vital Signs: BP (!) 102/52   Pulse (!) 51   Temp 98.1 F (36.7 C)   Resp 18   Ht 6' 1 (1.854 m)   Wt 131 lb 9.8 oz (59.7 kg)   SpO2 98%   BMI 17.36 kg/m   Physical Exam Vitals reviewed.  Constitutional:      General: He is not in acute distress.    Appearance: Normal appearance.  Cardiovascular:     Rate and Rhythm: Normal rate.  Pulmonary:     Effort: Pulmonary effort is normal.  Skin:    General: Skin is warm and dry.     Comments: Right flank drain appropriately dressed. Dressing was partially soiled, and removed. Drain incision site is severely tender to palpation, as it was prior to drain placement. There is no obvious evidence of infection. No erythema, induration, purulence, fluctuance, crepitous, though palpation is limited by patient's pain. Retaining suture and Stat Lock in place. However, Stat-lock was no longer engaging shut, and not holding the drain in place.  Approximately 20 cc or blood-tinged serous output in collection bulb, which was emptied. RN advised of volume  emptied out.  Line flushes well.    Neurological:     Mental Status: He is alert and oriented to person, place, and time.  Psychiatric:        Mood and Affect: Mood normal.        Behavior: Behavior normal.        Thought Content: Thought content normal.        Judgment: Judgment normal.      Labs:  CBC: Recent Labs    06/13/24 0604 06/14/24 0322 06/16/24 0334 06/17/24 0518  WBC 16.7* 14.1* 8.5 8.5  HGB 12.4* 11.8* 13.2 12.1*  HCT 35.1* 35.0* 39.8 36.5*  PLT 488* 479* 576* 560*    COAGS: No results for input(s): INR, APTT in the last 8760 hours.  BMP: Recent Labs    06/13/24 0604 06/14/24 0322 06/16/24 0334 06/17/24 0518  NA 138 134* 140 138  K 3.8 4.2 3.9 4.0  CL 102 102 107 102  CO2 25 24 28 29   GLUCOSE 99 113* 112* 109*  BUN 11 12 12 12   CALCIUM 8.6* 8.4* 8.7* 8.7*  CREATININE 0.96 0.85 0.81 0.85  GFRNONAA >60 >60 >60 >60    LIVER FUNCTION TESTS: Recent Labs    06/14/24 0322  BILITOT 0.6  AST 29  ALT 24  ALKPHOS 66  PROT 5.4*  ALBUMIN 2.2*    Assessment and Plan:  Drain Location: right flank Size: Fr size: 12 Fr Date of  placement: 06/13/24  Currently to: Drain collection device: suction bulb 24 hour output:  Output by Drain (mL) 06/15/24 0701 - 06/15/24 1900 06/15/24 1901 - 06/16/24 0700 06/16/24 0701 - 06/16/24 1900 06/16/24 1901 - 06/17/24 0700 06/17/24 0701 - 06/17/24 1622  Closed System Drain 1 Inferior;Lateral;Right Back 12 Fr.  50   20    Interval imaging/drain manipulation:  None. MRI was attempted, but patient's drain contains a small metallic component, which was tugged on with a magnetic wand during preparation for MRI, causing patient notable pain. He therefore refused the MRI, understandably. Patient's drain is conditional status with regards to MRI compatibility due to this very small metallic component. MRI can still be performed safely if drain is well secured, per MRI staff.   Current examination: Flushes easily.   Insertion site visibly unremarkable, though severely tender to palpation and drain manipulation, as per patient's baseline prior to drain placement. Suture and stat lock in place. However, the Stat-Lock was no longer securing patient's drain as the locking mechanism had failed. A new drain-specific Stat-lock closure device was provided to RN. The drain should be well secured with this appropriately in place. For MRI imaging, the drain hub may still benefit from additional securing with tape. Avoid securing to skin, as the metallic drain component may heat up during MRI. Dressing was partially soiled, and removed. RN agreed to replace new Stat-lock and dressing. Safe to proceed with MRI imaging, as long as patient is able to tolerate.   Plan: Continue TID flushes with 5 cc NS. Record output Q shift. Dressing changes QD or PRN if soiled.  Call IR APP or on call IR MD if difficulty flushing or sudden change in drain output.  Repeat imaging/possible drain injection once output < 10 mL/QD (excluding flush material). Consideration for drain removal if output is < 10 mL/QD (excluding flush material), pending discussion with the providing surgical service.  Discharge planning: Please contact IR APP or on call IR MD prior to patient d/c to ensure appropriate follow up plans are in place. Typically patient will follow up with IR clinic 10-14 days post d/c for repeat imaging/possible drain injection. IR scheduler will contact patient with date/time of appointment. Patient will need to flush drain QD with 5 cc NS, record output QD, dressing changes every 2-3 days or earlier if soiled.   IR will continue to follow - please call with questions or concerns.    Thank you for this interesting consult.  I greatly enjoyed meeting Craig Doyle and look forward to participating in their care.   Electronically Signed: Carlin DELENA Griffon, PA-C 06/17/2024, 4:22 PM     I spent a total of 25 Minutes at the the  patient's bedside AND on the patient's hospital floor or unit, greater than 50% of which was counseling/coordinating care for right psoas fluid collection, s/p drain placement.

## 2024-06-18 ENCOUNTER — Inpatient Hospital Stay: Payer: MEDICAID

## 2024-06-18 DIAGNOSIS — A4902 Methicillin resistant Staphylococcus aureus infection, unspecified site: Secondary | ICD-10-CM

## 2024-06-18 LAB — BASIC METABOLIC PANEL WITH GFR
Anion gap: 5 (ref 5–15)
BUN: 16 mg/dL (ref 6–20)
CO2: 31 mmol/L (ref 22–32)
Calcium: 9.3 mg/dL (ref 8.9–10.3)
Chloride: 104 mmol/L (ref 98–111)
Creatinine, Ser: 0.84 mg/dL (ref 0.61–1.24)
GFR, Estimated: >60 mL/min (ref >60)
Glucose, Bld: 99 mg/dL (ref 70–99)
Potassium: 4.7 mmol/L (ref 3.5–5.1)
Sodium: 140 mmol/L (ref 135–145)

## 2024-06-18 LAB — CULTURE, BLOOD (ROUTINE X 2)
Culture: NO GROWTH
Culture: NO GROWTH
Special Requests: ADEQUATE
Special Requests: ADEQUATE

## 2024-06-18 LAB — CBC
HCT: 39 % (ref 39.0–52.0)
Hemoglobin: 13.1 g/dL (ref 13.0–17.0)
MCH: 33.2 pg (ref 26.0–34.0)
MCHC: 33.6 g/dL (ref 30.0–36.0)
MCV: 98.7 fL (ref 80.0–100.0)
Platelets: 638 K/uL — ABNORMAL HIGH (ref 150–400)
RBC: 3.95 MIL/uL — ABNORMAL LOW (ref 4.22–5.81)
RDW: 11.9 % (ref 11.5–15.5)
WBC: 9.6 K/uL (ref 4.0–10.5)
nRBC: 0 % (ref 0.0–0.2)

## 2024-06-18 MED ORDER — HYDROMORPHONE HCL 1 MG/ML IJ SOLN
1.0000 mg | Freq: Once | INTRAMUSCULAR | Status: AC | PRN
  Administered 2024-06-18: 1 mg via INTRAVENOUS
  Filled 2024-06-18: qty 1

## 2024-06-18 MED ORDER — GADOBUTROL 1 MMOL/ML IV SOLN
5.0000 mL | Freq: Once | INTRAVENOUS | Status: AC | PRN
  Administered 2024-06-18: 5 mL via INTRAVENOUS

## 2024-06-18 NOTE — Progress Notes (Signed)
 Progress Note   Patient: Craig Doyle FMW:[id number] DOB: 05-18-1966 DOA: 06/12/2024     5 DOS: the patient was seen and examined on 06/18/2024   Brief hospital course: 58 y.o. male with medical history significant for Tobacco use disorder being admitted with a right psoas abscess.  He presented with a 9-day history of right lower abdominal and low back pain and intermittent fevers.  He denies dysuria, change in bowel habits, nausea or vomiting In the ED he was tachycardic to 116 with otherwise unremarkable vitals.  Labs notable for WBC 18,000, lactic acid pending EKG. showed NSR at 88 CT abdomen and pelvis showing right psoas muscle abscess the ED provider spoke with IR who will provide drainage in the a.m. Patient started on Zosyn  and given a fluid bolus and pain control Admission requested   8/18.  Case discussed with interventional radiology and they will bring down for drainage of abscess.  Continue IV antibiotics. 8/19.  Staphylococcus aureus growing out of culture.  Sensitivities pending.  Continue IV vancomycin  and d/c Zosyn . 8/20 ID rec MRI TLS to r/o discitis, patient had discomfort at drain site when screened with bedside magnet   Assessment and Plan: * Staphylococcus aureus sepsis (HCC)  Sepsis physiology is resolved. On admission had leukocytosis, tachycardia and psoas muscle abscess.  Status post drainage with IR on 8/18 w/ JP drain.  Staph aureus growing out of culture initially started on IV vancomycin .   TTE with no valve vegetations. Infectious disease consulted and would like an MRI of the T and L-spine - Per IR patient's drain has a small amount of metal they would like the drain to remain at this time.  An additional clamp was added to secure the drain.   -Discussed with patient and he would like to attempt MRI today or tomorrow, will place order with anxiolytics and pain medication to be given prior to imaging study -Continue daptomycin  per infectious disease  Psoas  abscess, right (HCC) Continue daptomycin .  Drain per IR.   Hypotension resolved Continues to have soft blood pressures though improved with IV fluids.  Likely low in the setting of infection.  Asymptomatic.    06/18/2024    7:18 AM 06/18/2024    4:06 AM 06/17/2024    8:01 PM  Vitals with BMI  Systolic 104 106 99  Diastolic 58 58 59  Pulse 50 61 65  Continue to monitor   Thrombocytosis Likely elevated due to infection.     Latest Ref Rng & Units 06/18/2024    4:48 AM 06/17/2024    5:18 AM 06/16/2024    3:34 AM  CBC  WBC 4.0 - 10.5 K/uL 9.6  8.5  8.5   Hemoglobin 13.0 - 17.0 g/dL 86.8  87.8  86.7   Hematocrit 39.0 - 52.0 % 39.0  36.5  39.8   Platelets 150 - 400 K/uL 638  560  576   Continue to monitor  Hyponatremia Resolved     Latest Ref Rng & Units 06/18/2024    4:48 AM 06/17/2024    5:18 AM 06/16/2024    3:34 AM  BMP  Glucose 70 - 99 mg/dL 99  890  887   BUN 6 - 20 mg/dL 16  12  12    Creatinine 0.61 - 1.24 mg/dL 9.15  9.14  9.18   Sodium 135 - 145 mmol/L 140  138  140   Potassium 3.5 - 5.1 mmol/L 4.7  4.0  3.9   Chloride 98 - 111  mmol/L 104  102  107   CO2 22 - 32 mmol/L 31  29  28    Calcium 8.9 - 10.3 mg/dL 9.3  8.7  8.7      Anemia of chronic disease Macrocytosis  Hgb 13.2 this morning.  Iron/TIBC/Ferritin/ %Sat    Component Value Date/Time   IRON 59 06/17/2024 0518   TIBC 186 (L) 06/17/2024 0518   FERRITIN 507 (H) 06/14/2024 0322   IRONPCTSAT 32 06/17/2024 0518   B12 and folate decreased.  Iron panel within normal limits Will supplement.  B12 and folate  Severe malnutrition BMI 17.36.  Dietitian consulted  Tobacco use disorder Continue nicotine  patch     Subjective: Continues to have pain at drain site.  Physical Exam: Vitals:   06/17/24 1712 06/17/24 2001 06/18/24 0406 06/18/24 0718  BP: 107/60 (!) 99/59 (!) 106/58 (!) 104/58  Pulse: (!) 59 65 61 (!) 50  Resp: 16 17 17 17   Temp: 98.2 F (36.8 C) 97.6 F (36.4 C) 98.5 F (36.9 C) 97.7 F  (36.5 C)  TempSrc:    Oral  SpO2: 100% 96% 98% 98%  Weight:      Height:         Constitutional: In no distress. Thin.  Cardiovascular: Normal rate, regular rhythm. No lower extremity edema  Pulmonary: Non labored breathing on room air, no wheezing or rales.  No lower extremity edema Abdominal: Soft.  Non distended. JP drain at R side SS output minimal. TTP in R side  Musculoskeletal: Normal range of motion.     Neurological: Alert and oriented to person, place, and time. Non focal  Skin: Skin is warm and dry.   Data Reviewed: Staph aureus growing out of culture.   Susceptibility  Methicillin resistant staphylococcus aureus (ZZ00)  Antibiotic Interpretation Microscan Method Status   CIPROFLOXACIN Resistant >=8 RESISTANT MIC Final   ERYTHROMYCIN Resistant >=8 RESISTANT MIC Final   GENTAMICIN Sensitive <=0.5 SENSITIVE MIC Final   OXACILLIN Resistant >=4 RESISTANT MIC Final   TETRACYCLINE Sensitive <=1 SENSITIVE MIC Final   VANCOMYCIN  Sensitive 1 SENSITIVE MIC Final   TRIMETH/SULFA Resistant >=320 RESISTANT MIC Final   CLINDAMYCIN Sensitive <=0.25 SENSITIVE MIC Final   RIFAMPIN Sensitive <=0.5 SENSITIVE MIC Final   Inducible Clindamycin Sensitive NEGATIVE MIC Final   LINEZOLID  Sensitive 2 SENSITIVE MIC Final   Susceptibility Comments  ABUNDANT METHICILL.  IN RESISTANT STAPHYLOCOCCUS AUREUS      Family Communication: Declined  Disposition: Status is: Inpatient Remains inpatient appropriate because: Continue IV antibiotics.  Continue JP drain   Planned Discharge Destination: Home    Time spent: 35 minutes  Author: Alban Pepper, MD 06/18/2024 3:42 PM  For on call review [email or web address].

## 2024-06-18 NOTE — Plan of Care (Signed)
   Problem: Education: Goal: Knowledge of General Education information will improve Description Including pain rating scale, medication(s)/side effects and non-pharmacologic comfort measures Outcome: Progressing   Problem: Health Behavior/Discharge Planning: Goal: Ability to manage health-related needs will improve Outcome: Progressing

## 2024-06-19 LAB — CBC
HCT: 40 % (ref 39.0–52.0)
Hemoglobin: 13.2 g/dL (ref 13.0–17.0)
MCH: 33.2 pg (ref 26.0–34.0)
MCHC: 33 g/dL (ref 30.0–36.0)
MCV: 100.5 fL — ABNORMAL HIGH (ref 80.0–100.0)
Platelets: 645 K/uL — ABNORMAL HIGH (ref 150–400)
RBC: 3.98 MIL/uL — ABNORMAL LOW (ref 4.22–5.81)
RDW: 11.8 % (ref 11.5–15.5)
WBC: 8.4 K/uL (ref 4.0–10.5)
nRBC: 0 % (ref 0.0–0.2)

## 2024-06-19 NOTE — Plan of Care (Signed)
  Problem: Education: Goal: Knowledge of General Education information will improve Description: Including pain rating scale, medication(s)/side effects and non-pharmacologic comfort measures Outcome: Progressing   Problem: Activity: Goal: Risk for activity intolerance will decrease Outcome: Progressing   Problem: Safety: Goal: Ability to remain free from injury will improve Outcome: Progressing   Problem: Skin Integrity: Goal: Skin integrity will improve Outcome: Progressing

## 2024-06-19 NOTE — Plan of Care (Signed)

## 2024-06-19 NOTE — Progress Notes (Signed)
 Progress Note   Patient: Craig Doyle FMW:[id number] DOB: 1966-01-04 DOA: 06/12/2024     6 DOS: the patient was seen and examined on 06/19/2024   Brief hospital course: 58 y.o. male with medical history significant for Tobacco use disorder being admitted with a right psoas abscess.  He presented with a 9-day history of right lower abdominal and low back pain and intermittent fevers.  He denies dysuria, change in bowel habits, nausea or vomiting In the ED he was tachycardic to 116 with otherwise unremarkable vitals.  Labs notable for WBC 18,000, lactic acid pending EKG. showed NSR at 88 CT abdomen and pelvis showing right psoas muscle abscess the ED provider spoke with IR who will provide drainage in the a.m. Patient started on Zosyn  and given a fluid bolus and pain control Admission requested   8/18.  Case discussed with interventional radiology and they will bring down for drainage of abscess.  Continue IV antibiotics. 8/19.  Staphylococcus aureus growing out of culture.  Sensitivities pending.  Continue IV vancomycin  and d/c Zosyn . 8/20 ID rec MRI TLS to r/o discitis, patient had discomfort at drain site when screened with bedside magnet   Assessment and Plan: * Staphylococcus aureus sepsis (HCC)  Sepsis physiology is resolved. On admission had leukocytosis, tachycardia and psoas muscle abscess.   Status post drainage with IR on 8/18 w/ JP drain.   Staph aureus growing out of culture initially started on IV vancomycin .   TTE with no valve vegetations. MRI of the T and L-spine no evidence of osteomyelitis or discitis Infectious disease consulted   - Continue daptomycin  per ID -Continue drain per IR  Psoas abscess, right (HCC) Continue daptomycin .  Drain per IR.   Hypotension resolved Continues to have soft blood pressures though improved with IV fluids.  Likely low in the setting of infection.  Asymptomatic.    06/19/2024    1:42 PM 06/19/2024    8:16 AM 06/19/2024    8:13 AM   Vitals with BMI  Systolic 100 98 95  Diastolic 64 62 55  Pulse 55 51 51  Continue to monitor   Thrombocytosis Likely elevated due to infection.     Latest Ref Rng & Units 06/19/2024    4:11 AM 06/18/2024    4:48 AM 06/17/2024    5:18 AM  CBC  WBC 4.0 - 10.5 K/uL 8.4  9.6  8.5   Hemoglobin 13.0 - 17.0 g/dL 86.7  86.8  87.8   Hematocrit 39.0 - 52.0 % 40.0  39.0  36.5   Platelets 150 - 400 K/uL 645  638  560   Continue to monitor  Hyponatremia Resolved     Latest Ref Rng & Units 06/18/2024    4:48 AM 06/17/2024    5:18 AM 06/16/2024    3:34 AM  BMP  Glucose 70 - 99 mg/dL 99  890  887   BUN 6 - 20 mg/dL 16  12  12    Creatinine 0.61 - 1.24 mg/dL 9.15  9.14  9.18   Sodium 135 - 145 mmol/L 140  138  140   Potassium 3.5 - 5.1 mmol/L 4.7  4.0  3.9   Chloride 98 - 111 mmol/L 104  102  107   CO2 22 - 32 mmol/L 31  29  28    Calcium 8.9 - 10.3 mg/dL 9.3  8.7  8.7      Anemia of chronic disease Macrocytosis  Hgb 13.2 this morning.  Iron/TIBC/Ferritin/ %Sat  Component Value Date/Time   IRON 59 06/17/2024 0518   TIBC 186 (L) 06/17/2024 0518   FERRITIN 507 (H) 06/14/2024 0322   IRONPCTSAT 32 06/17/2024 0518   B12 and folate decreased.  Iron panel within normal limits Will supplement.  B12 and folate  Severe malnutrition BMI 17.36.  Dietitian consulted  Tobacco use disorder Continue nicotine  patch     Subjective: Continues to have pain at drain site.  Physical Exam: Vitals:   06/19/24 0810 06/19/24 0813 06/19/24 0816 06/19/24 1342  BP: (!) 84/41 (!) 95/55 98/62 100/64  Pulse: (!) 50 (!) 51 (!) 51 (!) 55  Resp: 17     Temp: (!) 97.5 F (36.4 C)     TempSrc: Oral     SpO2: 98% 99% 98%   Weight:      Height:        Constitutional: In no distress. Thin. Cardiovascular: Normal rate, regular rhythm. No lower extremity edema  Pulmonary: Non labored breathing on room air, no wheezing or rales.   Abdominal: Soft. Non distended and non tenderJP drain at R side SS  output minimal. TTP in R side Musculoskeletal: Normal range of motion.     Neurological: Alert and oriented to person, place, and time. Non focal  Skin: Skin is warm and dry.   Data Reviewed: Staph aureus growing out of culture.   Susceptibility  Methicillin resistant staphylococcus aureus (ZZ00)  Antibiotic Interpretation Microscan Method Status   CIPROFLOXACIN Resistant >=8 RESISTANT MIC Final   ERYTHROMYCIN Resistant >=8 RESISTANT MIC Final   GENTAMICIN Sensitive <=0.5 SENSITIVE MIC Final   OXACILLIN Resistant >=4 RESISTANT MIC Final   TETRACYCLINE Sensitive <=1 SENSITIVE MIC Final   VANCOMYCIN  Sensitive 1 SENSITIVE MIC Final   TRIMETH/SULFA Resistant >=320 RESISTANT MIC Final   CLINDAMYCIN Sensitive <=0.25 SENSITIVE MIC Final   RIFAMPIN Sensitive <=0.5 SENSITIVE MIC Final   Inducible Clindamycin Sensitive NEGATIVE MIC Final   LINEZOLID  Sensitive 2 SENSITIVE MIC Final   Susceptibility Comments  ABUNDANT METHICILL.  IN RESISTANT STAPHYLOCOCCUS AUREUS      Family Communication: Declined  Disposition: Status is: Inpatient Remains inpatient appropriate because: Continue IV antibiotics.  Continue JP drain   Planned Discharge Destination: Home    Time spent: 35 minutes  Author: Alban Pepper, MD 06/19/2024 2:30 PM  For on call review [email or web address].

## 2024-06-20 NOTE — Plan of Care (Signed)

## 2024-06-20 NOTE — Progress Notes (Addendum)
 Date of Admission:  06/12/2024     ID: Craig Doyle is a 58 y.o. male Principal Problem:   Staphylococcus aureus sepsis (HCC) Active Problems:   Psoas abscess, right (HCC)   Hyponatremia   Thrombocytosis   Hypotension   Anemia of chronic disease   Staph aureus infection   MRSA infection   Protein-calorie malnutrition, severe    Subjective: Had MRI over the weekend No discitis or vertebral osteo  Medications:   vitamin B-12  500 mcg Oral Daily   feeding supplement  237 mL Oral BID BM   folic acid   1 mg Oral Daily   multivitamin with minerals  1 tablet Oral Daily   nicotine   14 mg Transdermal Daily   polyethylene glycol  17 g Oral Daily   senna  1 tablet Oral Daily   sodium chloride  flush  10 mL Intracatheter Q8H   thiamine   100 mg Oral Daily    Objective: Vital signs in last 24 hours: Patient Vitals for the past 24 hrs:  BP Temp Temp src Pulse Resp SpO2  06/20/24 0857 108/69 (!) 97.3 F (36.3 C) Oral (!) 51 16 99 %  06/20/24 0320 (!) 104/56 97.8 F (36.6 C) -- (!) 47 18 98 %  06/19/24 2051 (!) 102/52 98.8 F (37.1 C) -- (!) 51 16 100 %  06/19/24 1611 (!) 101/51 98 F (36.7 C) Oral (!) 57 15 98 %  06/19/24 1505 101/67 -- -- (!) 50 -- --  06/19/24 1342 100/64 -- -- (!) 55 -- --       PHYSICAL EXAM:  General: Alert, cooperative, no distress, appears stated age.  Lungs: Clear to auscultation bilaterally. No Wheezing or Rhonchi. No rales. Heart: Regular rate and rhythm, no murmur, rub or gallop. Abdomen: Soft, non-tender,not distended. Bowel sounds normal. No masses Rt flank area drain present- thin red fluid- less in qunatity Extremities: atraumatic, no cyanosis. No edema. No clubbing Skin: No rashes or lesions. Or bruising Lymph: Cervical, supraclavicular normal. Neurologic: Grossly non-focal  Lab Results    Latest Ref Rng & Units 06/19/2024    4:11 AM 06/18/2024    4:48 AM 06/17/2024    5:18 AM  CBC  WBC 4.0 - 10.5 K/uL 8.4  9.6  8.5   Hemoglobin  13.0 - 17.0 g/dL 86.7  86.8  87.8   Hematocrit 39.0 - 52.0 % 40.0  39.0  36.5   Platelets 150 - 400 K/uL 645  638  560        Latest Ref Rng & Units 06/18/2024    4:48 AM 06/17/2024    5:18 AM 06/16/2024    3:34 AM  CMP  Glucose 70 - 99 mg/dL 99  890  887   BUN 6 - 20 mg/dL 16  12  12    Creatinine 0.61 - 1.24 mg/dL 9.15  9.14  9.18   Sodium 135 - 145 mmol/L 140  138  140   Potassium 3.5 - 5.1 mmol/L 4.7  4.0  3.9   Chloride 98 - 111 mmol/L 104  102  107   CO2 22 - 32 mmol/L 31  29  28    Calcium 8.9 - 10.3 mg/dL 9.3  8.7  8.7       Microbiology: Abscess culture MRSA  Studies/Results: MR Lumbar Spine W Wo Contrast Result Date: 06/19/2024 CLINICAL DATA:  58 year old male with right pararenal psoas muscle, retroperitoneal abscess. Purulent fluid drained under CT guidance. Possible discitis. EXAM: MRI LUMBAR SPINE WITHOUT AND  WITH CONTRAST TECHNIQUE: Multiplanar and multiecho pulse sequences of the lumbar spine were obtained without and with intravenous contrast. CONTRAST:  5mL GADAVIST  GADOBUTROL  1 MMOL/ML IV SOLN COMPARISON:  Thoracic MRI reported separately today. CT Abdomen and Pelvis 06/12/2024. FINDINGS: Segmentation:  Normal, concordant with the thoracic numbering today. Alignment: Maintained lumbar lordosis with mild to moderate underlying levoconvex lumbar scoliosis. No spondylolisthesis. Vertebrae: Maintained lumbar vertebral height. Normal background bone marrow signal. Chronic degenerative endplate marrow signal changes at L5-S1 asymmetric to the left (series 1, image 12). Faint degenerative endplate marrow edema at the L2 anterior superior endplate. No other marrow edema or acute osseous abnormality. No abnormal vertebral or disc enhancement. Intact visible sacrum and SI joints. Conus medullaris and cauda equina: Conus extends to the L1 level. No lower spinal cord or conus signal abnormality. Capacious spinal canal. Normal cauda equina nerve roots. No abnormal intradural enhancement.  No dural thickening. Paraspinal and other soft tissues: Percutaneous drain within the lateral right psoas muscle abscess region (series 4, image 19 and series 7, image 19) which abuts the right pararenal space as on the CT 06/12/2024. Fluid volume is reduced. Abnormal enhancement of the muscle surrounding the drain there. Renal enhancement, morphology appears fairly symmetric and within normal limits. No psoas edema abutting the spine. Negative left psoas. Posterior paraspinal soft tissues are within normal limits. Other visible abdominal viscera are stable. Disc levels: Normal for age T12-L1 through L3-L4. L4-L5: Minor disc desiccation and disc bulging. Mild to moderate facet hypertrophy. No spinal or lateral recess stenosis. Mild L4 neural foraminal stenosis. L5-S1: Disc desiccation. Mild disc space loss. Asymmetric circumferential disc osteophyte complex to the left (series 4, image 35). Mild posterior element hypertrophy. No spinal or lateral recess stenosis. Mild L5 foraminal stenosis on the left. IMPRESSION: 1. Negative for lumbar discitis osteomyelitis. No acute or inflammatory lumbar spinal process. 2. Percutaneous drain within the right lateral psoas muscle abscess, abutting the right pararenal space. Fluid volume appears reduced since CT on 06/12/2024. 3. Mild for age lower lumbar spine degeneration. No spinal stenosis. Electronically Signed   By: VEAR Hurst M.D.   On: 06/19/2024 03:47   MR THORACIC SPINE W WO CONTRAST Result Date: 06/19/2024 CLINICAL DATA:  58 year old male with right pararenal psoas muscle, retroperitoneal abscess. Possible discitis. EXAM: MRI THORACIC WITHOUT AND WITH CONTRAST TECHNIQUE: Multiplanar and multiecho pulse sequences of the thoracic spine were obtained without and with intravenous contrast. CONTRAST:  5mL GADAVIST  GADOBUTROL  1 MMOL/ML IV SOLN COMPARISON:  Chest radiographs 06/11/2012. FINDINGS: Limited cervical spine imaging:  Negative for age. Thoracic spine segmentation:   Appears to be normal. Alignment: Mildly exaggerated thoracic kyphosis. No spondylolisthesis. No scoliosis. Vertebrae: Maintained thoracic vertebral height. Background bone marrow signal is normal. Occasional small degenerative thoracic endplate Schmorl's nodes (superiorly at T7 series 17, image 9). No marrow edema or evidence of acute osseous abnormality. No abnormal enhancement identified. Cord: Normal. No abnormal intradural enhancement. No dural thickening. Incidental dorsal epidural lipomatosis in the thoracic spine, maximal at T7 (series 18, image 9 and series 22, image 22. Paraspinal and other soft tissues: Small layering left pleural effusion (series 21, image 24). Otherwise negative visible chest. Negative visible upper abdomen. Negative thoracic paraspinal soft tissues. Disc levels: Age-appropriate thoracic spine degeneration. No disc herniation. No thoracic spinal stenosis. No neural foraminal stenosis. IMPRESSION: 1. Normal for age MRI appearance of the Thoracic Spine. No acute or inflammatory process identified. 2. Small layering left pleural effusion. Electronically Signed   By: VEAR Hurst HERO.D.  On: 06/19/2024 03:42     Assessment/Plan: Right psoas abscess secondary to MRSA Patient has a drain placed by IR The source of the psoas abscess is unclear  MRI of the thoracolumbar spine tNo discitis/vertebral osteomyelitis-  Blood culture neg so far 2D echo no  valve vegetation TEE recommended- but patient refuses . He is going to need 4-6 weeks of antibiotic Patient is currently on daptomycin  Cannot be sent home with IV due to many reasons and one them is social ( he has no running water/electricity at home, lives by himself- option would be to do linezolid  for 2 weeks and then weekly Dalbavancin X 2    History of substance use Urine tox screen neg   Leukocytosis resolved  thrombocytosis secondary to the above infection- persist and worsening  Hypoalbuminemia  HIV non  reactive  Discussed the management with patient and hospitalist Follow up as OP 07/05/24 at 11.30 Am

## 2024-06-20 NOTE — Plan of Care (Signed)
   Problem: Activity: Goal: Risk for activity intolerance will decrease Outcome: Progressing   Problem: Nutrition: Goal: Adequate nutrition will be maintained Outcome: Progressing   Problem: Coping: Goal: Level of anxiety will decrease Outcome: Progressing

## 2024-06-20 NOTE — Progress Notes (Signed)
 Progress Note   Patient: Craig Doyle FMW:[id number] DOB: [birth year] DOA: 06/12/2024     7 DOS: the patient was seen and examined on 06/20/2024   Brief hospital course: 58 y.o. male with medical history significant for Tobacco use disorder being admitted with a right psoas abscess.  He presented with a 9-day history of right lower abdominal and low back pain and intermittent fevers.  He denies dysuria, change in bowel habits, nausea or vomiting In the ED he was tachycardic to 116 with otherwise unremarkable vitals.  Labs notable for WBC 18,000, lactic acid pending EKG. showed NSR at 88 CT abdomen and pelvis showing right psoas muscle abscess the ED provider spoke with IR who will provide drainage in the a.m. Patient started on Zosyn  and given a fluid bolus and pain control Admission requested   8/18.  Case discussed with interventional radiology and they will bring down for drainage of abscess.  Continue IV antibiotics. 8/19.  Staphylococcus aureus growing out of culture.  Sensitivities pending.  Continue IV vancomycin  and d/c Zosyn . 8/20 ID rec MRI TLS to r/o discitis, patient had discomfort at drain site when screened with bedside magnet   Assessment and Plan: * Staphylococcus aureus sepsis (HCC)  Sepsis physiology is resolved. On admission had leukocytosis, tachycardia and psoas muscle abscess.   Status post drainage with IR on 8/18 w/ JP drain.   Staph aureus growing out of culture initially started on IV vancomycin .   TTE with no valve vegetations. Bcx NG at 5d MRI of the T and L-spine no evidence of osteomyelitis or discitis Infectious disease consulted   Infectious disease would like TEE given source of MRSA infection remains unclear. He is adamant he does not want to undergo this procedure.   -Continue daptomycin  per ID, f/u w/ ID regarding duration of antibiotics -Continue drain per IR   Psoas abscess, right (HCC) Continue daptomycin .  Drain per IR.   Hypotension  resolved Continues to have soft blood pressures though improved with IV fluids.  Likely low in the setting of infection.  Asymptomatic.    06/20/2024    3:00 PM 06/20/2024    8:57 AM 06/20/2024    3:20 AM  Vitals with BMI  Systolic 110 108 895  Diastolic 48 69 56  Pulse 54 51 47  Continue to monitor   Thrombocytosis Likely elevated due to infection.     Latest Ref Rng & Units 06/19/2024    4:11 AM 06/18/2024    4:48 AM 06/17/2024    5:18 AM  CBC  WBC 4.0 - 10.5 K/uL 8.4  9.6  8.5   Hemoglobin 13.0 - 17.0 g/dL 86.7  86.8  87.8   Hematocrit 39.0 - 52.0 % 40.0  39.0  36.5   Platelets 150 - 400 K/uL 645  638  560   Continue to monitor  Hyponatremia Resolved     Latest Ref Rng & Units 06/18/2024    4:48 AM 06/17/2024    5:18 AM 06/16/2024    3:34 AM  BMP  Glucose 70 - 99 mg/dL 99  890  887   BUN 6 - 20 mg/dL 16  12  12    Creatinine 0.61 - 1.24 mg/dL 9.15  9.14  9.18   Sodium 135 - 145 mmol/L 140  138  140   Potassium 3.5 - 5.1 mmol/L 4.7  4.0  3.9   Chloride 98 - 111 mmol/L 104  102  107   CO2 22 - 32 mmol/L 31  29  28   Calcium 8.9 - 10.3 mg/dL 9.3  8.7  8.7      Anemia of chronic disease Macrocytosis  Iron/TIBC/Ferritin/ %Sat    Component Value Date/Time   IRON 59 06/17/2024 0518   TIBC 186 (L) 06/17/2024 0518   FERRITIN 507 (H) 06/14/2024 0322   IRONPCTSAT 32 06/17/2024 0518   B12 and folate decreased.  Iron panel within normal limits Continue B12 and folate supplementation  Severe malnutrition BMI 17.36.  Continue to encourage p.o. intake and meal supplements  Tobacco use disorder Continue nicotine  patch     Subjective: Pain is improved, but still has discomfort at drain site.  Physical Exam: Vitals:   06/19/24 2051 06/20/24 0320 06/20/24 0857 06/20/24 1500  BP: (!) 102/52 (!) 104/56 108/69 (!) 110/48  Pulse: (!) 51 (!) 47 (!) 51 (!) 54  Resp: 16 18 16 16   Temp: 98.8 F (37.1 C) 97.8 F (36.6 C) (!) 97.3 F (36.3 C) (!) 97.5 F (36.4 C)   TempSrc:   Oral Oral  SpO2: 100% 98% 99% 99%  Weight:      Height:        Constitutional: In no distress. Thin. Cardiovascular: Normal rate, regular rhythm. No lower extremity edema  Pulmonary: Non labored breathing on room air, no wheezing or rales.   Abdominal: Soft. Non distended and non tender. JP drain at R side w/ scant SS output. Mild TTP in R side.  Musculoskeletal: Normal range of motion.     Neurological: Alert and oriented to person, place, and time. Non focal  Skin: Skin is warm and dry.   Data Reviewed: Staph aureus growing out of culture.   Susceptibility  Methicillin resistant staphylococcus aureus (ZZ00)  Antibiotic Interpretation Microscan Method Status   CIPROFLOXACIN Resistant >=8 RESISTANT MIC Final   ERYTHROMYCIN Resistant >=8 RESISTANT MIC Final   GENTAMICIN Sensitive <=0.5 SENSITIVE MIC Final   OXACILLIN Resistant >=4 RESISTANT MIC Final   TETRACYCLINE Sensitive <=1 SENSITIVE MIC Final   VANCOMYCIN  Sensitive 1 SENSITIVE MIC Final   TRIMETH/SULFA Resistant >=320 RESISTANT MIC Final   CLINDAMYCIN Sensitive <=0.25 SENSITIVE MIC Final   RIFAMPIN Sensitive <=0.5 SENSITIVE MIC Final   Inducible Clindamycin Sensitive NEGATIVE MIC Final   LINEZOLID  Sensitive 2 SENSITIVE MIC Final   Susceptibility Comments  ABUNDANT METHICILL.  IN RESISTANT STAPHYLOCOCCUS AUREUS      Family Communication: Declined  Disposition: Status is: Inpatient Remains inpatient appropriate because: Continue IV antibiotics.  Continue JP drain   Planned Discharge Destination: Home    Time spent: 35 minutes  Author: Alban Pepper, MD 06/20/2024 3:07 PM  For on call review [email or web address].

## 2024-06-21 ENCOUNTER — Other Ambulatory Visit: Payer: Self-pay

## 2024-06-21 ENCOUNTER — Inpatient Hospital Stay: Payer: MEDICAID

## 2024-06-21 LAB — CBC
HCT: 37.3 % — ABNORMAL LOW (ref 39.0–52.0)
Hemoglobin: 12.3 g/dL — ABNORMAL LOW (ref 13.0–17.0)
MCH: 33.2 pg (ref 26.0–34.0)
MCHC: 33 g/dL (ref 30.0–36.0)
MCV: 100.8 fL — ABNORMAL HIGH (ref 80.0–100.0)
Platelets: 594 K/uL — ABNORMAL HIGH (ref 150–400)
RBC: 3.7 MIL/uL — ABNORMAL LOW (ref 4.22–5.81)
RDW: 11.9 % (ref 11.5–15.5)
WBC: 9.7 K/uL (ref 4.0–10.5)
nRBC: 0 % (ref 0.0–0.2)

## 2024-06-21 MED ORDER — OXYCODONE HCL 5 MG PO TABS
5.0000 mg | ORAL_TABLET | Freq: Four times a day (QID) | ORAL | 0 refills | Status: AC | PRN
  Filled 2024-06-21: qty 20, 5d supply, fill #0

## 2024-06-21 MED ORDER — DIAZEPAM 5 MG PO TABS
5.0000 mg | ORAL_TABLET | Freq: Once | ORAL | Status: AC
  Administered 2024-06-21: 5 mg via ORAL
  Filled 2024-06-21: qty 1

## 2024-06-21 MED ORDER — NICOTINE 21 MG/24HR TD PT24: 21.0000 mg | MEDICATED_PATCH | Freq: Every day | TRANSDERMAL | Status: DC

## 2024-06-21 MED ORDER — IOHEXOL 9 MG/ML PO SOLN
500.0000 mL | ORAL | Status: AC
  Administered 2024-06-21 (×2): 500 mL via ORAL

## 2024-06-21 MED ORDER — FOLIC ACID 1 MG PO TABS
1.0000 mg | ORAL_TABLET | Freq: Every day | ORAL | 1 refills | Status: AC
  Filled 2024-06-21: qty 90, 90d supply, fill #0

## 2024-06-21 MED ORDER — ADULT MULTIVITAMIN W/MINERALS CH
1.0000 | ORAL_TABLET | Freq: Every day | ORAL | 0 refills | Status: AC
  Filled 2024-06-21: qty 90, 90d supply, fill #0

## 2024-06-21 MED ORDER — IOHEXOL 300 MG/ML  SOLN
100.0000 mL | Freq: Once | INTRAMUSCULAR | Status: AC | PRN
  Administered 2024-06-21: 100 mL via INTRAVENOUS

## 2024-06-21 MED ORDER — LINEZOLID 600 MG PO TABS
600.0000 mg | ORAL_TABLET | Freq: Two times a day (BID) | ORAL | 0 refills | Status: AC
  Filled 2024-06-21: qty 28, 14d supply, fill #0

## 2024-06-21 MED ORDER — CYANOCOBALAMIN 500 MCG PO TABS
500.0000 ug | ORAL_TABLET | Freq: Every day | ORAL | 1 refills | Status: AC
  Filled 2024-06-21: qty 90, 90d supply, fill #0

## 2024-06-21 MED ORDER — NICOTINE POLACRILEX 2 MG MT GUM: 2.0000 mg | CHEWING_GUM | OROMUCOSAL | Status: DC | PRN

## 2024-06-21 MED ORDER — ACETAMINOPHEN 325 MG PO TABS
650.0000 mg | ORAL_TABLET | Freq: Four times a day (QID) | ORAL | 0 refills | Status: AC | PRN
  Filled 2024-06-21: qty 90, 12d supply, fill #0

## 2024-06-21 NOTE — Progress Notes (Addendum)
 Progress Note   Patient: Craig Doyle FMW:[id number] DOB: [birth year] DOA: 06/12/2024     8 DOS: the patient was seen and examined on 06/21/2024   Brief hospital course: 58 y.o. male with medical history significant for Tobacco use disorder being admitted with a right psoas abscess.  He presented with a 9-day history of right lower abdominal and low back pain and intermittent fevers.  He denies dysuria, change in bowel habits, nausea or vomiting In the ED he was tachycardic to 116 with otherwise unremarkable vitals.  Labs notable for WBC 18,000, lactic acid pending EKG. showed NSR at 88 CT abdomen and pelvis showing right psoas muscle abscess the ED provider spoke with IR who will provide drainage in the a.m. Patient started on Zosyn  and given a fluid bolus and pain control Admission requested   8/18.  Case discussed with interventional radiology and they will bring down for drainage of abscess.  Continue IV antibiotics. 8/19.  Staphylococcus aureus growing out of culture.  Sensitivities pending.  Continue IV vancomycin  and d/c Zosyn . 8/20 ID rec MRI TLS to r/o discitis, patient had discomfort at drain site when screened with bedside magnet   Assessment and Plan: * Staphylococcus aureus sepsis (HCC)  Sepsis physiology is resolved. On admission had leukocytosis, tachycardia and psoas muscle abscess.   Status post drainage with IR on 8/18 w/ JP drain.   Staph aureus growing out of culture initially started on IV vancomycin .   TTE with no valve vegetations. Bcx NG at 5d MRI of the T and L-spine no evidence of osteomyelitis or discitis Infectious disease consulted   Infectious disease would like TEE given source of MRSA infection remains unclear. He is adamant he does not want to undergo this procedure.   - On discharge patient will continue antibiotics with p.o. linezolid .  He will get dalbavancin infusions per infectious disease. - Repeating CT scan so drain can hopefully be  removed.   Psoas abscess, right (HCC) Continue daptomycin  while inpatient.  Patient does not feel comfortable taking care of drainage.  Currently he has no running water in his home. IR stated to repeat CT scan of his abdomen and they would remove drain if able CT abdomen pelvis ordered   Thrombocytosis Likely elevated due to infection.     Latest Ref Rng & Units 06/21/2024    6:17 AM 06/19/2024    4:11 AM 06/18/2024    4:48 AM  CBC  WBC 4.0 - 10.5 K/uL 9.7  8.4  9.6   Hemoglobin 13.0 - 17.0 g/dL 87.6  86.7  86.8   Hematocrit 39.0 - 52.0 % 37.3  40.0  39.0   Platelets 150 - 400 K/uL 594  645  638   Continue to monitor   Anemia of chronic disease Macrocytosis  Iron/TIBC/Ferritin/ %Sat    Component Value Date/Time   IRON 59 06/17/2024 0518   TIBC 186 (L) 06/17/2024 0518   FERRITIN 507 (H) 06/14/2024 0322   IRONPCTSAT 32 06/17/2024 0518      Latest Ref Rng & Units 06/21/2024    6:17 AM 06/19/2024    4:11 AM 06/18/2024    4:48 AM  CBC  WBC 4.0 - 10.5 K/uL 9.7  8.4  9.6   Hemoglobin 13.0 - 17.0 g/dL 87.6  86.7  86.8   Hematocrit 39.0 - 52.0 % 37.3  40.0  39.0   Platelets 150 - 400 K/uL 594  645  638     B12 and folate decreased.  Iron  panel within normal limits Continue B12 and folate supplementation  Severe malnutrition BMI 17.36.  Continue to encourage p.o. intake and meal supplements  Tobacco use disorder Continue nicotine  patch, prn gum     Subjective: Continues to have some pain.  Is anxious from nicotine  withdrawal.  Physical Exam: Vitals:   06/20/24 1939 06/21/24 0437 06/21/24 0741 06/21/24 1546  BP: 112/62 109/60 123/74 (!) 122/54  Pulse: 61 (!) 56 (!) 52 (!) 57  Resp: 16 20 16 16   Temp: 97.7 F (36.5 C)  98.6 F (37 C) 98.5 F (36.9 C)  TempSrc: Oral     SpO2: 99% 98% 100% 99%  Weight:      Height:         Constitutional: In no distress.  Thin, anxious Cardiovascular: Normal rate, regular rhythm. No lower extremity edema  Pulmonary: Non  labored breathing on room air, no wheezing or rales.   Abdominal: Soft.  Mildly tender to palpation in the right side, right JP drain with scant serosanguineous output Musculoskeletal: Normal range of motion.     Neurological: Alert and oriented to person, place, and time. Non focal  Skin: Skin is warm and dry.   Data Reviewed: Staph aureus growing out of culture.   Susceptibility  Methicillin resistant staphylococcus aureus (ZZ00)  Antibiotic Interpretation Microscan Method Status   CIPROFLOXACIN Resistant >=8 RESISTANT MIC Final   ERYTHROMYCIN Resistant >=8 RESISTANT MIC Final   GENTAMICIN Sensitive <=0.5 SENSITIVE MIC Final   OXACILLIN Resistant >=4 RESISTANT MIC Final   TETRACYCLINE Sensitive <=1 SENSITIVE MIC Final   VANCOMYCIN  Sensitive 1 SENSITIVE MIC Final   TRIMETH/SULFA Resistant >=320 RESISTANT MIC Final   CLINDAMYCIN Sensitive <=0.25 SENSITIVE MIC Final   RIFAMPIN Sensitive <=0.5 SENSITIVE MIC Final   Inducible Clindamycin Sensitive NEGATIVE MIC Final   LINEZOLID  Sensitive 2 SENSITIVE MIC Final   Susceptibility Comments  ABUNDANT METHICILL.  IN RESISTANT STAPHYLOCOCCUS AUREUS      Family Communication: Declined  Disposition: Status is: Inpatient Remains inpatient appropriate because: Continue IV antibiotics.  Continue JP drain   Planned Discharge Destination: Home    Time spent: 35 minutes  Author: Alban Pepper, MD 06/21/2024 5:23 PM  For on call review [email or web address].

## 2024-06-21 NOTE — Progress Notes (Signed)
 Date of Admission:  06/12/2024     ID: Craig Doyle is a 58 y.o. male Principal Problem:   Staphylococcus aureus sepsis (HCC) Active Problems:   Psoas abscess, right (HCC)   Hyponatremia   Thrombocytosis   Hypotension   Anemia of chronic disease   Staph aureus infection   MRSA infection   Protein-calorie malnutrition, severe    Subjective: Pt wants to go home Does not want TEE  Medications:   vitamin B-12  500 mcg Oral Daily   diazepam   5 mg Oral Once   feeding supplement  237 mL Oral BID BM   folic acid   1 mg Oral Daily   iohexol   500 mL Oral Q1H   multivitamin with minerals  1 tablet Oral Daily   [START ON 06/22/2024] nicotine   21 mg Transdermal Daily   polyethylene glycol  17 g Oral Daily   senna  1 tablet Oral Daily   sodium chloride  flush  10 mL Intracatheter Q8H   thiamine   100 mg Oral Daily    Objective: Vital signs in last 24 hours: Patient Vitals for the past 24 hrs:  BP Temp Temp src Pulse Resp SpO2  06/21/24 0741 123/74 98.6 F (37 C) -- (!) 52 16 100 %  06/21/24 0437 109/60 -- -- (!) 56 20 98 %  06/20/24 1939 112/62 97.7 F (36.5 C) Oral 61 16 99 %  06/20/24 1500 (!) 110/48 (!) 97.5 F (36.4 C) Oral (!) 54 16 99 %       PHYSICAL EXAM:  General: Alert, cooperative, no distress, appears stated age.  Lungs: Clear to auscultation bilaterally. No Wheezing or Rhonchi. No rales. Heart: Regular rate and rhythm, no murmur, rub or gallop. Abdomen: Soft, non-tender,not distended. Bowel sounds normal. No masses Rt flank area drain present- minimal fluid present Extremities: atraumatic, no cyanosis. No edema. No clubbing Skin: No rashes or lesions. Or bruising Lymph: Cervical, supraclavicular normal. Neurologic: Grossly non-focal  Lab Results    Latest Ref Rng & Units 06/21/2024    6:17 AM 06/19/2024    4:11 AM 06/18/2024    4:48 AM  CBC  WBC 4.0 - 10.5 K/uL 9.7  8.4  9.6   Hemoglobin 13.0 - 17.0 g/dL 87.6  86.7  86.8   Hematocrit 39.0 - 52.0 % 37.3   40.0  39.0   Platelets 150 - 400 K/uL 594  645  638        Latest Ref Rng & Units 06/18/2024    4:48 AM 06/17/2024    5:18 AM 06/16/2024    3:34 AM  CMP  Glucose 70 - 99 mg/dL 99  890  887   BUN 6 - 20 mg/dL 16  12  12    Creatinine 0.61 - 1.24 mg/dL 9.15  9.14  9.18   Sodium 135 - 145 mmol/L 140  138  140   Potassium 3.5 - 5.1 mmol/L 4.7  4.0  3.9   Chloride 98 - 111 mmol/L 104  102  107   CO2 22 - 32 mmol/L 31  29  28    Calcium 8.9 - 10.3 mg/dL 9.3  8.7  8.7       Microbiology: Abscess culture MRSA     Assessment/Plan: Right psoas abscess secondary to MRSA Patient has a drain placed by IR The source of the psoas abscess is unclear  MRI of the thoracolumbar spine tNo discitis/vertebral osteomyelitis-  Blood culture neg so far 2D echo no  valve vegetation TEE  recommended- but patient refuses . He is going to need 4-6 weeks of antibiotic Patient is currently on daptomycin  Cannot be sent home with IV due to many reasons and one them is social ( he has no running water/electricity at home, lives by himself- option would be to do linezolid  for 2 weeks and then weekly Dalbavancin X 2 Discussed the side effects of linezolid  and also asked pharmacist to discuss with him    History of substance use Urine tox screen neg   Leukocytosis resolved  thrombocytosis secondary to the above infection- persist and worsening  Hypoalbuminemia  HIV non reactive  Discussed the management with patient and hospitalist This assessment involved complex antimicrobial management Follow up as OP 07/05/24 at 11.30 Am ID will sign off.

## 2024-06-22 LAB — BASIC METABOLIC PANEL WITH GFR
Anion gap: 8 (ref 5–15)
BUN: 17 mg/dL (ref 6–20)
CO2: 30 mmol/L (ref 22–32)
Calcium: 9.3 mg/dL (ref 8.9–10.3)
Chloride: 101 mmol/L (ref 98–111)
Creatinine, Ser: 0.92 mg/dL (ref 0.61–1.24)
GFR, Estimated: >60 mL/min (ref >60)
Glucose, Bld: 124 mg/dL — ABNORMAL HIGH (ref 70–99)
Potassium: 4.1 mmol/L (ref 3.5–5.1)
Sodium: 139 mmol/L (ref 135–145)

## 2024-06-22 LAB — CBC
HCT: 39.6 % (ref 39.0–52.0)
Hemoglobin: 13.1 g/dL (ref 13.0–17.0)
MCH: 32.6 pg (ref 26.0–34.0)
MCHC: 33.1 g/dL (ref 30.0–36.0)
MCV: 98.5 fL (ref 80.0–100.0)
Platelets: 614 K/uL — ABNORMAL HIGH (ref 150–400)
RBC: 4.02 MIL/uL — ABNORMAL LOW (ref 4.22–5.81)
RDW: 11.9 % (ref 11.5–15.5)
WBC: 10.2 K/uL (ref 4.0–10.5)
nRBC: 0 % (ref 0.0–0.2)

## 2024-06-22 LAB — AEROBIC/ANAEROBIC CULTURE W GRAM STAIN (SURGICAL/DEEP WOUND)

## 2024-06-22 LAB — CK: Total CK: 16 U/L — ABNORMAL LOW (ref 49–397)

## 2024-06-22 NOTE — Discharge Instructions (Addendum)
 Your nurse navigator, Niasia Lanphear, can be reached at [phone].  [facility] [address] [place], [place] [address] Open Monday-Thursday from 8:30am to 1pm.      *showers, laundry, snacks, case management, nurse on siteThe [employer]  [address] [place] [address] [phone]  **Once Medicaid is approved, you may use the following for transportation to/from appointments. [phone]. Please give them 48 hours notice for scheduling**

## 2024-06-22 NOTE — Progress Notes (Signed)
 Rounded on patient. Provided him with change of clothing for d/c. Also contacted his post-release officer Beverley Saber, per his request, to let him know he would be discharging this afternoon.  Patient plans to reach out to [facility] for further assistance with showering, phone, etc. Also plans to follow up with [employer].   Again, patient states he prefers his current living situation.

## 2024-06-22 NOTE — Plan of Care (Signed)

## 2024-06-22 NOTE — Discharge Summary (Signed)
 Craig Doyle FMW:[id number] DOB: 08/29/1966 DOA: 06/12/2024  PCP: Pcp, No  Admit date: 06/12/2024 Discharge date: 06/22/2024  Time spent: 35 minutes  Recommendations for Outpatient Follow-up:  Pcp f/u as scheduled ID f/u as scheduled (9/4)     Discharge Diagnoses:  Principal Problem:   Staphylococcus aureus sepsis (HCC) Active Problems:   Psoas abscess, right (HCC)   Hypotension   Thrombocytosis   Hyponatremia   Anemia of chronic disease   Staph aureus infection   MRSA infection   Protein-calorie malnutrition, severe   Discharge Condition: improved  Diet recommendation: heart healthy  Filed Weights   06/12/24 1822 06/12/24 2304  Weight: 61.2 kg 59.7 kg    History of present illness:  From admission h and p Craig Doyle is a 58 y.o. male with medical history significant for Tobacco use disorder being admitted with a right psoas abscess.  He presented with a 9-day history of right lower abdominal and low back pain and intermittent fevers.  He denies dysuria, change in bowel habits, nausea or vomiting In the ED he was tachycardic to 116 with otherwise unremarkable vitals.  Labs notable for WBC 18,000, lactic acid pending EKG. showed NSR at 88 CT abdomen and pelvis showing right psoas muscle abscess the ED provider spoke with IR who will provide drainage in the a.m. Patient started on Zosyn  and given a fluid bolus and pain control  Hospital Course:  Patient presents with abdominal pain and fever. Found to have right psoas abscess secondary to MRSA. Source unclear. TTE neg, patient refused TEE. Blood cultures neg. No vertebral discitis/osteo. ID followed here. Patient was treated with IV abx and IR drain. Repeat CT scan shows resolution of abscess, drain output is low, IR removed drain day of discharge. Plan per ID is linezolid  for 2 more weeks, will f/u with ID on 9/4, additional antibiotic plan per them (considering dalbavancin after linezolid ). Patient is feeling well and  desires discharge.   Procedures: IR drain placement   Consultations: IR, ID  Discharge Exam: Vitals:   06/22/24 0401 06/22/24 0753  BP: 121/68 102/63  Pulse: 65 60  Resp: 20 16  Temp: 97.7 F (36.5 C) 97.6 F (36.4 C)  SpO2: 99% 96%    General: NAD Cardiovascular: RRR Respiratory: CTAB  Discharge Instructions    Allergies as of 06/22/2024   No Known Allergies      Medication List     TAKE these medications    acetaminophen  325 MG tablet Commonly known as: TYLENOL  Take 2 tablets (650 mg total) by mouth every 6 (six) hours as needed for mild pain (pain score 1-3) or fever (or Fever >/= 101).   Daily-Vite Multivitamin Tabs Take 1 tablet by mouth daily.   folic acid  1 MG tablet Commonly known as: FOLVITE  Take 1 tablet (1 mg total) by mouth daily.   ibuprofen 200 MG tablet Commonly known as: ADVIL Take 600 mg by mouth every 6 (six) hours as needed for moderate pain (pain score 4-6) or mild pain (pain score 1-3).   linezolid  600 MG tablet Commonly known as: ZYVOX  Take 1 tablet (600 mg total) by mouth 2 (two) times daily for 14 days.   NYQUIL PO Take 15 mLs by mouth every 6 (six) hours.   oxyCODONE  5 MG immediate release tablet Commonly known as: Roxicodone  Take 1 tablet (5 mg total) by mouth every 6 (six) hours as needed for up to 5 days for severe pain (pain score 7-10).   vitamin B-12  500 MCG tablet Commonly known as: CYANOCOBALAMIN  Take 1 tablet (500 mcg total) by mouth daily.       No Known Allergies  Follow-up Information     Franchot Isaiah LABOR, MD. Go on 07/21/2024.   Specialty: Family Medicine Why: Please go to your NEW PATIENT APPOINTMENT on 07/21/24 at 1:20pm. Please arrive 15 minutes early. Contact information: [address] [place] [address] [phone]                  The results of significant diagnostics from this hospitalization (including imaging, microbiology, ancillary and laboratory) are listed  below for reference.    Significant Diagnostic Studies: CT ABDOMEN PELVIS W CONTRAST Result Date: 06/21/2024 CLINICAL DATA:  Right psoas abscess, prior percutaneous drainage EXAM: CT ABDOMEN AND PELVIS WITH CONTRAST TECHNIQUE: Multidetector CT imaging of the abdomen and pelvis was performed using the standard protocol following bolus administration of intravenous contrast. RADIATION DOSE REDUCTION: This exam was performed according to the departmental dose-optimization program which includes automated exposure control, adjustment of the mA and/or kV according to patient size and/or use of iterative reconstruction technique. CONTRAST:  OMNIPAQUE  IOHEXOL  300 MG/ML  SOLN COMPARISON:  06/12/2024 FINDINGS: Lower chest: Emphysema. Hepatobiliary: Contracted gallbladder. No significant hepatic findings. Pancreas: Unremarkable Spleen: Unremarkable Adrenals/Urinary Tract: Mild right posteromedial perirenal stranding adjacent to the drain psoas abscess. Stable subtle hypoenhancing portion of the renal parenchyma of the medial right kidney on image 31 series 2 and images 20-21 of series 7, raising suspicion for mild adjacent right pyelonephritis. Left kidney appears normal. Adrenal glands unremarkable. Urinary bladder unremarkable. Stomach/Bowel: Prominent stool throughout the colon favors constipation. Normal appendix. Vascular/Lymphatic: Atherosclerosis is present, including aortoiliac atherosclerotic disease. Reproductive: Unremarkable Other: No supplemental non-categorized findings. Musculoskeletal: The right psoas abscess has been effectively drained by the pigtail catheter and appears collapsed without discernible substantial fluid collection. Correlate with drain output. IMPRESSION: 1. The right psoas abscess has been effectively drained by the pigtail catheter and appears collapsed without discernible substantial fluid collection. Correlate with drain output. 2. Stable subtle hypoenhancing portion of the medial  right kidney adjacent to the drain, raising suspicion for mild adjacent right pyelonephritis. 3. Prominent stool throughout the colon favors constipation. 4. Aortic Atherosclerosis (ICD10-I70.0) and Emphysema (ICD10-J43.9). Electronically Signed   By: Ryan Salvage M.D.   On: 06/21/2024 20:44   MR Lumbar Spine W Wo Contrast Result Date: 06/19/2024 CLINICAL DATA:  58 year old male with right pararenal psoas muscle, retroperitoneal abscess. Purulent fluid drained under CT guidance. Possible discitis. EXAM: MRI LUMBAR SPINE WITHOUT AND WITH CONTRAST TECHNIQUE: Multiplanar and multiecho pulse sequences of the lumbar spine were obtained without and with intravenous contrast. CONTRAST:  5mL GADAVIST  GADOBUTROL  1 MMOL/ML IV SOLN COMPARISON:  Thoracic MRI reported separately today. CT Abdomen and Pelvis 06/12/2024. FINDINGS: Segmentation:  Normal, concordant with the thoracic numbering today. Alignment: Maintained lumbar lordosis with mild to moderate underlying levoconvex lumbar scoliosis. No spondylolisthesis. Vertebrae: Maintained lumbar vertebral height. Normal background bone marrow signal. Chronic degenerative endplate marrow signal changes at L5-S1 asymmetric to the left (series 1, image 12). Faint degenerative endplate marrow edema at the L2 anterior superior endplate. No other marrow edema or acute osseous abnormality. No abnormal vertebral or disc enhancement. Intact visible sacrum and SI joints. Conus medullaris and cauda equina: Conus extends to the L1 level. No lower spinal cord or conus signal abnormality. Capacious spinal canal. Normal cauda equina nerve roots. No abnormal intradural enhancement. No dural thickening. Paraspinal and other soft tissues: Percutaneous  drain within the lateral right psoas muscle abscess region (series 4, image 19 and series 7, image 19) which abuts the right pararenal space as on the CT 06/12/2024. Fluid volume is reduced. Abnormal enhancement of the muscle surrounding the  drain there. Renal enhancement, morphology appears fairly symmetric and within normal limits. No psoas edema abutting the spine. Negative left psoas. Posterior paraspinal soft tissues are within normal limits. Other visible abdominal viscera are stable. Disc levels: Normal for age T12-L1 through L3-L4. L4-L5: Minor disc desiccation and disc bulging. Mild to moderate facet hypertrophy. No spinal or lateral recess stenosis. Mild L4 neural foraminal stenosis. L5-S1: Disc desiccation. Mild disc space loss. Asymmetric circumferential disc osteophyte complex to the left (series 4, image 35). Mild posterior element hypertrophy. No spinal or lateral recess stenosis. Mild L5 foraminal stenosis on the left. IMPRESSION: 1. Negative for lumbar discitis osteomyelitis. No acute or inflammatory lumbar spinal process. 2. Percutaneous drain within the right lateral psoas muscle abscess, abutting the right pararenal space. Fluid volume appears reduced since CT on 06/12/2024. 3. Mild for age lower lumbar spine degeneration. No spinal stenosis. Electronically Signed   By: VEAR Hurst M.D.   On: 06/19/2024 03:47   MR THORACIC SPINE W WO CONTRAST Result Date: 06/19/2024 CLINICAL DATA:  58 year old male with right pararenal psoas muscle, retroperitoneal abscess. Possible discitis. EXAM: MRI THORACIC WITHOUT AND WITH CONTRAST TECHNIQUE: Multiplanar and multiecho pulse sequences of the thoracic spine were obtained without and with intravenous contrast. CONTRAST:  5mL GADAVIST  GADOBUTROL  1 MMOL/ML IV SOLN COMPARISON:  Chest radiographs 06/11/2012. FINDINGS: Limited cervical spine imaging:  Negative for age. Thoracic spine segmentation:  Appears to be normal. Alignment: Mildly exaggerated thoracic kyphosis. No spondylolisthesis. No scoliosis. Vertebrae: Maintained thoracic vertebral height. Background bone marrow signal is normal. Occasional small degenerative thoracic endplate Schmorl's nodes (superiorly at T7 series 17, image 9). No marrow  edema or evidence of acute osseous abnormality. No abnormal enhancement identified. Cord: Normal. No abnormal intradural enhancement. No dural thickening. Incidental dorsal epidural lipomatosis in the thoracic spine, maximal at T7 (series 18, image 9 and series 22, image 22. Paraspinal and other soft tissues: Small layering left pleural effusion (series 21, image 24). Otherwise negative visible chest. Negative visible upper abdomen. Negative thoracic paraspinal soft tissues. Disc levels: Age-appropriate thoracic spine degeneration. No disc herniation. No thoracic spinal stenosis. No neural foraminal stenosis. IMPRESSION: 1. Normal for age MRI appearance of the Thoracic Spine. No acute or inflammatory process identified. 2. Small layering left pleural effusion. Electronically Signed   By: VEAR Hurst M.D.   On: 06/19/2024 03:42   ECHOCARDIOGRAM COMPLETE Result Date: 06/16/2024    ECHOCARDIOGRAM REPORT   Patient Name:   JEVAN GAUNT Date of Exam: 06/15/2024 Medical Rec #:  [id number]     Height:       73.0 in Accession #:    [id number]    Weight:       131.6 lb Date of Birth:  1966/01/23     BSA:          1.801 m Patient Age:    58 years      BP:           122/70 mmHg Patient Gender: M             HR:           60 bpm. Exam Location:  [facility] Procedure: 2D Echo, Cardiac Doppler and Color Doppler (Both Spectral and Color  Flow Doppler were utilized during procedure). Indications:     I38 Endocarditis  History:         Patient has no prior history of Echocardiogram examinations.  Sonographer:     Carl Coma RDCS Referring Phys:  [id number] DONALD BERLIN Diagnosing Phys: Evalene Lunger MD IMPRESSIONS  1. Left ventricular ejection fraction, by estimation, is 60 to 65%. The left ventricle has normal function. The left ventricle has no regional wall motion abnormalities. Left ventricular diastolic parameters were normal.  2. Right ventricular systolic function is normal. The right ventricular size is  normal.  3. The mitral valve is normal in structure. Mild mitral valve regurgitation. No evidence of mitral stenosis.  4. Tricuspid valve regurgitation is mild to moderate.  5. The aortic valve is normal in structure. Aortic valve regurgitation is not visualized. No aortic stenosis is present.  6. The inferior vena cava is normal in size with greater than 50% respiratory variability, suggesting right atrial pressure of 3 mmHg.  7. no valve vegetation noted FINDINGS  Left Ventricle: Left ventricular ejection fraction, by estimation, is 60 to 65%. The left ventricle has normal function. The left ventricle has no regional wall motion abnormalities. Strain was performed and the global longitudinal strain is indeterminate. The left ventricular internal cavity size was normal in size. There is no left ventricular hypertrophy. Left ventricular diastolic parameters were normal. Right Ventricle: The right ventricular size is normal. No increase in right ventricular wall thickness. Right ventricular systolic function is normal. Left Atrium: Left atrial size was normal in size. Right Atrium: Right atrial size was normal in size. Pericardium: There is no evidence of pericardial effusion. Mitral Valve: The mitral valve is normal in structure. There is mild calcification of the mitral valve leaflet(s). Mild mitral valve regurgitation. No evidence of mitral valve stenosis. There is no evidence of mitral valve vegetation. Tricuspid Valve: The tricuspid valve is normal in structure. Tricuspid valve regurgitation is mild to moderate. No evidence of tricuspid stenosis. There is no evidence of tricuspid valve vegetation. Aortic Valve: The aortic valve is normal in structure. Aortic valve regurgitation is not visualized. No aortic stenosis is present. There is no evidence of aortic valve vegetation. Pulmonic Valve: The pulmonic valve was normal in structure. Pulmonic valve regurgitation is not visualized. No evidence of pulmonic  stenosis. Aorta: The aortic root is normal in size and structure. Venous: The inferior vena cava is normal in size with greater than 50% respiratory variability, suggesting right atrial pressure of 3 mmHg. IAS/Shunts: No atrial level shunt detected by color flow Doppler. Additional Comments: 3D was performed not requiring image post processing on an independent workstation and was indeterminate.  LEFT VENTRICLE PLAX 2D LVIDd:         5.10 cm   Diastology LVIDs:         3.40 cm   LV e' medial:    8.92 cm/s LV PW:         0.60 cm   LV E/e' medial:  7.1 LV IVS:        0.60 cm   LV e' lateral:   12.75 cm/s LVOT diam:     2.20 cm   LV E/e' lateral: 4.9 LV SV:         80 LV SV Index:   44 LVOT Area:     3.80 cm  RIGHT VENTRICLE             IVC RV Basal diam:  3.90 cm  IVC diam: 1.70 cm RV S prime:     16.25 cm/s TAPSE (M-mode): 2.4 cm LEFT ATRIUM             Index        RIGHT ATRIUM           Index LA diam:        4.20 cm 2.33 cm/m   RA Area:     16.40 cm LA Vol (A2C):   57.9 ml 32.15 ml/m  RA Volume:   49.50 ml  27.48 ml/m LA Vol (A4C):   51.3 ml 28.48 ml/m LA Biplane Vol: 57.0 ml 31.65 ml/m  AORTIC VALVE LVOT Vmax:   99.25 cm/s LVOT Vmean:  61.900 cm/s LVOT VTI:    0.210 m  AORTA Ao Root diam: 3.50 cm MITRAL VALVE MV Area (PHT): 3.24 cm    SHUNTS MV Decel Time: 234 msec    Systemic VTI:  0.21 m MV E velocity: 63.00 cm/s  Systemic Diam: 2.20 cm MV A velocity: 58.50 cm/s MV E/A ratio:  1.08 Evalene Lunger MD Electronically signed by Evalene Lunger MD Signature Date/Time: 06/16/2024/4:46:53 PM    Final    CT GUIDED PERITONEAL/RETROPERITONEAL FLUID DRAIN BY PERC CATH Result Date: 06/13/2024 INDICATION: 58 year old male with right retroperitoneal abscess. He presents for CT-guided drain placement. EXAM: CT-guided drain placement into right retroperitoneal abscess TECHNIQUE: Multidetector CT imaging of the abdomen was performed following the standard protocol without IV contrast. RADIATION DOSE REDUCTION: This  exam was performed according to the departmental dose-optimization program which includes automated exposure control, adjustment of the mA and/or kV according to patient size and/or use of iterative reconstruction technique. MEDICATIONS: The patient is currently admitted to the hospital and receiving intravenous antibiotics. The antibiotics were administered within an appropriate time frame prior to the initiation of the procedure. ANESTHESIA/SEDATION: Moderate (conscious) sedation was employed during this procedure. A total of Versed  2 mg and Fentanyl  100 mcg was administered intravenously by the radiology nurse. Total intra-service moderate Sedation Time: 15 minutes. The patient's level of consciousness and vital signs were monitored continuously by radiology nursing throughout the procedure under my direct supervision. COMPLICATIONS: None immediate. PROCEDURE: Informed written consent was obtained from the patient after a thorough discussion of the procedural risks, benefits and alternatives. All questions were addressed. Maximal Sterile Barrier Technique was utilized including caps, mask, sterile gowns, sterile gloves, sterile drape, hand hygiene and skin antiseptic. A timeout was performed prior to the initiation of the procedure. A planning CT scan was performed. The complex fluid collection in the right retroperitoneal space located along the anterior aspect of the psoas muscle and just posterior to the perinephric space was successfully identified. A skin entry site was selected and marked. Local anesthesia was attained by infiltration with 1% lidocaine. A small dermatotomy was made. Under intermittent CT guidance, an 18 gauge trocar needle was advanced into the collection. A 0.035 wire was then coiled within the collection. The percutaneous tract was dilated to 12 [place]. A 12 French drainage catheter was advanced over the wire and formed. Aspiration yields 35 mL thick bloody purulent fluid. Samples were  sent for Gram stain and culture. The abscess cavity was then lavaged with sterile saline and the drain was connected to JP bulb suction. The drain was secured to the skin with 0 Prolene suture. Sterile bandages were applied. Post placement CT imaging demonstrates a well-positioned drainage catheter and no evidence of complication. IMPRESSION: Successful placement of 12 French drainage catheter into right retroperitoneal  abscess collection yielding 35 mL thick purulent fluid. Electronically Signed   By: Wilkie Lent M.D.   On: 06/13/2024 17:10   CT ABDOMEN PELVIS W CONTRAST Result Date: 06/12/2024 CLINICAL DATA:  Abdominal pain, flank pain EXAM: CT ABDOMEN AND PELVIS WITH CONTRAST TECHNIQUE: Multidetector CT imaging of the abdomen and pelvis was performed using the standard protocol following bolus administration of intravenous contrast. RADIATION DOSE REDUCTION: This exam was performed according to the departmental dose-optimization program which includes automated exposure control, adjustment of the mA and/or kV according to patient size and/or use of iterative reconstruction technique. CONTRAST:  OMNIPAQUE  IOHEXOL  300 MG/ML  SOLN COMPARISON:  04/10/2010 FINDINGS: Lower chest: No acute findings Hepatobiliary: No focal hepatic abnormality. Gallbladder unremarkable. Pancreas: No focal abnormality or ductal dilatation. Spleen: No focal abnormality.  Normal size. Adrenals/Urinary Tract: No adrenal abnormality. No focal renal abnormality. No stones or hydronephrosis. Urinary bladder is unremarkable. Stomach/Bowel: Stomach, large and small bowel grossly unremarkable. Appendix normal. Vascular/Lymphatic: Aortic atherosclerosis. No evidence of aneurysm or adenopathy. Reproductive: No visible focal abnormality. Other: No free fluid or free air. Musculoskeletal: No acute bony abnormality. Fluid collection with rim enhancement noted in the right psoas muscle measuring 5 x 2.7 cm compatible with psoas abscess.  IMPRESSION: 5 x 2.7 cm fluid collection with rim enhancement in the right psoas muscle compatible with psoas abscess. Aortic atherosclerosis. Electronically Signed   By: Franky Crease M.D.   On: 06/12/2024 20:30    Microbiology: Recent Results (from the past 240 hours)  Urine Culture     Status: Abnormal   Collection Time: 06/12/24  6:24 PM   Specimen: Urine, Clean Catch  Result Value Ref Range Status   Specimen Description   Final    URINE, CLEAN CATCH Performed at [facility], [address], [place], [place] [address]    Special Requests   Final    NONE Performed at [facility], [address] [place], [place] [address]    Culture (A)  Final    <10,000 COLONIES/mL INSIGNIFICANT GROWTH Performed at [facility], [address] [place], [place] [address]    Report Status 06/14/2024 FINAL  Final  Culture, blood (Routine X 2) w Reflex to ID Panel     Status: None   Collection Time: 06/12/24 11:27 PM   Specimen: BLOOD  Result Value Ref Range Status   Specimen Description BLOOD BLOOD RIGHT FOREARM  Final   Special Requests   Final    BOTTLES DRAWN AEROBIC AND ANAEROBIC Blood Culture adequate volume   Culture   Final    NO GROWTH 5 DAYS Performed at [facility], [address] [place], [place] [address]    Report Status 06/18/2024 FINAL  Final  Culture, blood (Routine X 2) w Reflex to ID Panel     Status: None   Collection Time: 06/12/24 11:27 PM   Specimen: BLOOD  Result Value Ref Range Status   Specimen Description BLOOD BLOOD RIGHT WRIST  Final   Special Requests   Final    BOTTLES DRAWN AEROBIC AND ANAEROBIC Blood Culture adequate volume   Culture   Final    NO GROWTH 5 DAYS Performed at [facility], [address] [place], [place] [address]    Report Status 06/18/2024 FINAL  Final  Aerobic/Anaerobic Culture w Gram Stain (surgical/deep wound)     Status: None (Preliminary result)   Collection Time: 06/13/24  4:42 PM    Specimen: Abscess  Result Value  Ref Range Status   Specimen Description   Final    ABSCESS Performed at [facility], [address] [place], [place] [address]    Special Requests NONE  Final   Gram Stain MODERATE WBC SEEN MODERATE GRAM POSITIVE COCCI   Final   Culture   Final    ABUNDANT METHICILLIN RESISTANT STAPHYLOCOCCUS AUREUS NO ANAEROBES ISOLATED Sent to [facility] for further susceptibility testing. Performed at [facility], [address], [place], [place] [address]    Report Status PENDING  Incomplete   Organism ID, Bacteria METHICILLIN RESISTANT STAPHYLOCOCCUS AUREUS  Final      Susceptibility   Methicillin resistant staphylococcus aureus - MIC*    CIPROFLOXACIN >=8 RESISTANT Resistant     ERYTHROMYCIN >=8 RESISTANT Resistant     GENTAMICIN <=0.5 SENSITIVE Sensitive     OXACILLIN >=4 RESISTANT Resistant     TETRACYCLINE <=1 SENSITIVE Sensitive     VANCOMYCIN  1 SENSITIVE Sensitive     TRIMETH/SULFA >=320 RESISTANT Resistant     CLINDAMYCIN <=0.25 SENSITIVE Sensitive     RIFAMPIN <=0.5 SENSITIVE Sensitive     Inducible Clindamycin NEGATIVE Sensitive     LINEZOLID  2 SENSITIVE Sensitive     * ABUNDANT METHICILLIN RESISTANT STAPHYLOCOCCUS AUREUS  MIC (1 Drug)-Abscess; 06/13/2024; Abscess; MRSA; Daptomycin      Status: Abnormal   Collection Time: 06/13/24  4:42 PM   Specimen: Abscess  Result Value Ref Range Status   Min Inhibitory Conc (1 Drug) Preliminary report (A)  Final    Comment: (NOTE) Performed At: [facility] [address] [place], [place] [id number] Jennette Shorter MD Ey:[id number]    Source psoas abscess  Final    Comment: Performed at [facility], [address], [place], [place] [address]  MIC Result     Status: Abnormal   Collection Time: 06/13/24  4:42 PM  Result Value Ref Range Status   Result 1 (MIC) Comment (A)  Final    Comment: (NOTE) Methicillin - resistant Staphylococcus aureus Identification performed by  account, not confirmed by this laboratory. Performed At: [facility] [address] [place], [place] [id number] Jennette Shorter MD Ey:[id number]      Labs: Basic Metabolic Panel: Recent Labs  Lab 06/16/24 0334 06/17/24 0518 06/18/24 0448 06/22/24 0628  NA 140 138 140 139  K 3.9 4.0 4.7 4.1  CL 107 102 104 101  CO2 28 29 31 30   GLUCOSE 112* 109* 99 124*  BUN 12 12 16 17   CREATININE 0.81 0.85 0.84 0.92  CALCIUM 8.7* 8.7* 9.3 9.3  MG 2.3  --   --   --   PHOS 3.5  --   --   --    Liver Function Tests: No results for input(s): AST, ALT, ALKPHOS, BILITOT, PROT, ALBUMIN in the last 168 hours. No results for input(s): LIPASE, AMYLASE in the last 168 hours. No results for input(s): AMMONIA in the last 168 hours. CBC: Recent Labs  Lab 06/16/24 0334 06/17/24 0518 06/18/24 0448 06/19/24 0411 06/21/24 0617 06/22/24 0628  WBC 8.5 8.5 9.6 8.4 9.7 10.2  NEUTROABS 5.8 5.5  --   --   --   --   HGB 13.2 12.1* 13.1 13.2 12.3* 13.1  HCT 39.8 36.5* 39.0 40.0 37.3* 39.6  MCV 101.5* 100.0 98.7 100.5* 100.8* 98.5  PLT 576* 560* 638* 645* 594* 614*   Cardiac Enzymes: Recent Labs  Lab 06/17/24 0518 06/22/24 0628  CKTOTAL 12* 16*   BNP: BNP (last 3  results) No results for input(s): BNP in the last 8760 hours.  ProBNP (last 3 results) No results for input(s): PROBNP in the last 8760 hours.  CBG: No results for input(s): GLUCAP in the last 168 hours.     Signed:  Devaughn KATHEE Ban MD.  [facility] 06/22/2024, 1:25 PM

## 2024-06-22 NOTE — Progress Notes (Signed)
 Brief Interventional Radiology Note:  Request for drain removal on 8/26 received from Dr. Kandis. IR Dr. Luverne recommended repeat CT to be completed prior to removal. Repeat scan reviewed with Dr. DELENA Balder which shows resolution of psoas fluid collection. Also noted to have minimal output for several days. Drain was removed at the bedside without difficulty. Overlying dressing placed on top and additional dressing supplies provided to the patient. Discussed after care with the patient including, but not limited to, no soaking or submerging the area under water until fully healed. Patient verbalized understanding. All questions and concerns answered at the bedside.   Electronically Signed: Omega Durante M Atarah Cadogan, PA-C 06/22/2024, 2:24 PM

## 2024-06-22 NOTE — Progress Notes (Signed)
 Nutrition Follow-up  DOCUMENTATION CODES:   Underweight, Severe malnutrition in context of chronic illness  INTERVENTION:   -Continue regular diet -Continue Ensure Plus High Protein po BID, each supplement provides 350 kcal and 20 grams of protein  -Continue MVI with minerals daily -Continue 1 mg folic acid  daily -Continue 100 mg thiamine  daily   NUTRITION DIAGNOSIS:   Severe Malnutrition related to social / environmental circumstances as evidenced by severe fat depletion, severe muscle depletion.  Ongoing  GOAL:   Patient will meet greater than or equal to 90% of their needs  Progressing   MONITOR:   PO intake, Supplement acceptance  REASON FOR ASSESSMENT:   Consult Assessment of nutrition requirement/status  ASSESSMENT:   Pt with medical history significant for Tobacco use disorder being admitted with a right psoas abscess.  He presented with a 9-day history of right lower abdominal and low back pain and intermittent fevers.  8/18- s/p CT guided placement of 85F drain into right retroperitoneal abscess   Reviewed I/O's: +1.2 L x 24 hours and +9.8 L since admission  Spoke with pt at bedside, who was pleasant and in good spirits today. Pt more alert and conversant and appears generally more well in comparison to previous visit. Pt reports feeling better and very appreciative of hospital staff. Pt shares good appetite and consuming most meals (meal completions 100%). Pt drinking Ensure and would like to continue them.   Pt offered no complaints other than feeling cold; RD adjusted thermostat and provided cup of coffee per his request.   Per MD notes, plan to discharge today.   Labs reviewed.    Diet Order:   Diet Order             Diet - low sodium heart healthy           Diet regular Room service appropriate? Yes; Fluid consistency: Thin  Diet effective now                   EDUCATION NEEDS:   Education needs have been addressed  Skin:  Skin  Assessment: Reviewed RN Assessment  Last BM:  06/20/24  Height:   Ht Readings from Last 1 Encounters:  06/12/24 6' 1 (1.854 m)    Weight:   Wt Readings from Last 1 Encounters:  06/12/24 59.7 kg    Ideal Body Weight:  83.6 kg  BMI:  Body mass index is 17.36 kg/m.  Estimated Nutritional Needs:   Kcal:  1900-2100  Protein:  105-120 grams  Fluid:  1.9-2.1 L    Margery ORN, RD, LDN, CDCES Registered Dietitian III Certified Diabetes Care and Education Specialist If unable to reach this RD, please use RD Inpatient group chat on secure chat between hours of 8am-4 pm daily

## 2024-06-23 LAB — MINIMUM INHIBITORY CONC. (1 DRUG)

## 2024-06-23 LAB — MIC RESULT

## 2024-06-30 ENCOUNTER — Inpatient Hospital Stay: Payer: Self-pay | Admitting: Infectious Diseases

## 2024-07-05 ENCOUNTER — Inpatient Hospital Stay: Payer: Self-pay | Admitting: Infectious Diseases

## 2024-07-08 ENCOUNTER — Telehealth: Payer: Self-pay

## 2024-07-21 ENCOUNTER — Ambulatory Visit: Payer: Self-pay

## 2024-07-21 NOTE — Progress Notes (Deleted)
 New patient visit   Patient: Craig Doyle   DOB: 1966/06/04   58 y.o. Male  MRN: [id number] Visit Date: 07/21/2024  Today's healthcare provider: Isaiah DELENA Pepper, MD   No chief complaint on file.  Subjective    Craig Doyle is a 58 y.o. male who presents today as a new patient to establish care.   HPI: Psoas abscess- drained by IR and started on linezolid  x2 weeks     No past medical history on file. No past surgical history on file. No family status information on file.   No family history on file. Social History   Socioeconomic History   Marital status: Single    Spouse name: Not on file   Number of children: Not on file   Years of education: Not on file   Highest education level: Not on file  Occupational History   Not on file  Tobacco Use   Smoking status: Every Day    Current packs/day: 1.00    Average packs/day: 0.6 packs/day for 45.1 years (25.9 ttl pk-yrs)    Types: Cigarettes    Start date: 06/13/1979    Passive exposure: Current   Smokeless tobacco: Never  Vaping Use   Vaping status: Never Used  Substance and Sexual Activity   Alcohol  use: Not Currently    Comment: 2 40's a day, none since 9 days   Drug use: Not Currently   Sexual activity: Not Currently  Other Topics Concern   Not on file  Social History Narrative   Not on file   Social Drivers of Health   Financial Resource Strain: Not on file  Food Insecurity: Food Insecurity Present (06/12/2024)   Hunger Vital Sign    Worried About Running Out of Food in the Last Year: Often true    Ran Out of Food in the Last Year: Often true  Transportation Needs: Unmet Transportation Needs (06/12/2024)   PRAPARE - Administrator, Civil Service (Medical): Yes    Lack of Transportation (Non-Medical): Yes  Physical Activity: Not on file  Stress: Not on file  Social Connections: Not on file   Outpatient Medications Prior to Visit  Medication Sig   acetaminophen  (TYLENOL ) 325 MG tablet  Take 2 tablets (650 mg total) by mouth every 6 (six) hours as needed for mild pain (pain score 1-3) or fever (or Fever >/= 101).   cyanocobalamin  (VITAMIN B12) 500 MCG tablet Take 1 tablet (500 mcg total) by mouth daily.   folic acid  (FOLVITE ) 1 MG tablet Take 1 tablet (1 mg total) by mouth daily.   ibuprofen (ADVIL) 200 MG tablet Take 600 mg by mouth every 6 (six) hours as needed for moderate pain (pain score 4-6) or mild pain (pain score 1-3).   Multiple Vitamin (MULTIVITAMIN WITH MINERALS) TABS tablet Take 1 tablet by mouth daily.   Pseudoeph-Doxylamine-DM-APAP (NYQUIL PO) Take 15 mLs by mouth every 6 (six) hours.   No facility-administered medications prior to visit.   No Known Allergies  Reviews of Systems as noted in HPI.  {Insert previous labs (optional):23779} {See past labs  Heme  Chem  Endocrine  Serology  Results Review (optional):1}   Objective    There were no vitals taken for this visit. {Insert last BP/Wt (optional):23777}{See vitals history (optional):1}   Physical Exam  Depression Screen     No data to display         No results found for any visits on 07/21/24.  Assessment & Plan      Problem List Items Addressed This Visit   None   Assessment and Plan              No follow-ups on file.      Isaiah DELENA Pepper, MD  [facility] [phone] (phone) [phone] (fax)
